# Patient Record
Sex: Female | Born: 1980 | Race: Black or African American | Hispanic: No | Marital: Married | Smoking: Never smoker
Health system: Southern US, Community
[De-identification: ages and names within clinical notes are randomized; demographics above are authoritative.]

## PROBLEM LIST (undated history)

## (undated) ENCOUNTER — Inpatient Hospital Stay (HOSPITAL_COMMUNITY): Payer: Self-pay

## (undated) DIAGNOSIS — R7303 Prediabetes: Secondary | ICD-10-CM

## (undated) DIAGNOSIS — D649 Anemia, unspecified: Secondary | ICD-10-CM

## (undated) DIAGNOSIS — O24419 Gestational diabetes mellitus in pregnancy, unspecified control: Secondary | ICD-10-CM

## (undated) DIAGNOSIS — K219 Gastro-esophageal reflux disease without esophagitis: Secondary | ICD-10-CM

## (undated) HISTORY — DX: Prediabetes: R73.03

## (undated) HISTORY — PX: CHOLECYSTECTOMY: SHX55

---
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
## 2004-09-08 ENCOUNTER — Other Ambulatory Visit: Admission: RE | Admit: 2004-09-08 | Discharge: 2004-09-08 | Payer: Self-pay | Admitting: Obstetrics and Gynecology

## 2005-07-26 ENCOUNTER — Other Ambulatory Visit: Admission: RE | Admit: 2005-07-26 | Discharge: 2005-07-26 | Payer: Self-pay | Admitting: Obstetrics & Gynecology

## 2006-02-18 ENCOUNTER — Other Ambulatory Visit: Admission: RE | Admit: 2006-02-18 | Discharge: 2006-02-18 | Payer: Self-pay | Admitting: Obstetrics & Gynecology

## 2006-10-28 ENCOUNTER — Other Ambulatory Visit: Admission: RE | Admit: 2006-10-28 | Discharge: 2006-10-28 | Payer: Self-pay | Admitting: Obstetrics and Gynecology

## 2009-10-17 ENCOUNTER — Other Ambulatory Visit: Admission: RE | Admit: 2009-10-17 | Discharge: 2009-10-17 | Payer: Self-pay | Admitting: Family Medicine

## 2010-05-01 ENCOUNTER — Other Ambulatory Visit (HOSPITAL_COMMUNITY)
Admission: RE | Admit: 2010-05-01 | Discharge: 2010-05-01 | Disposition: A | Discharge status: Home / Self Care | Payer: BC Managed Care – PPO | Source: Ambulatory Visit | Attending: Family Medicine | Admitting: Family Medicine

## 2010-05-01 ENCOUNTER — Other Ambulatory Visit: Payer: Self-pay | Admitting: Family Medicine

## 2010-05-01 DIAGNOSIS — Z124 Encounter for screening for malignant neoplasm of cervix: Secondary | ICD-10-CM | POA: Insufficient documentation

## 2010-10-23 ENCOUNTER — Other Ambulatory Visit: Payer: Self-pay | Admitting: Family Medicine

## 2010-10-23 DIAGNOSIS — R52 Pain, unspecified: Secondary | ICD-10-CM

## 2010-10-23 DIAGNOSIS — R609 Edema, unspecified: Secondary | ICD-10-CM

## 2010-10-29 ENCOUNTER — Ambulatory Visit
Admission: RE | Admit: 2010-10-29 | Discharge: 2010-10-29 | Disposition: A | Discharge status: Home / Self Care | Payer: BC Managed Care – PPO | Source: Ambulatory Visit | Attending: Family Medicine | Admitting: Family Medicine

## 2010-10-29 DIAGNOSIS — R609 Edema, unspecified: Secondary | ICD-10-CM

## 2010-10-29 DIAGNOSIS — R52 Pain, unspecified: Secondary | ICD-10-CM

## 2010-11-19 ENCOUNTER — Other Ambulatory Visit: Payer: Self-pay | Admitting: Family Medicine

## 2010-11-19 ENCOUNTER — Other Ambulatory Visit (HOSPITAL_COMMUNITY)
Admission: RE | Admit: 2010-11-19 | Discharge: 2010-11-19 | Disposition: A | Discharge status: Home / Self Care | Payer: BC Managed Care – PPO | Source: Ambulatory Visit | Attending: Family Medicine | Admitting: Family Medicine

## 2010-11-19 DIAGNOSIS — Z124 Encounter for screening for malignant neoplasm of cervix: Secondary | ICD-10-CM | POA: Insufficient documentation

## 2011-01-26 ENCOUNTER — Ambulatory Visit (INDEPENDENT_AMBULATORY_CARE_PROVIDER_SITE_OTHER): Payer: 59 | Admitting: General Surgery

## 2011-01-26 ENCOUNTER — Encounter (INDEPENDENT_AMBULATORY_CARE_PROVIDER_SITE_OTHER): Payer: Self-pay | Admitting: General Surgery

## 2011-01-26 VITALS — BP 104/68 | HR 80 | Temp 98.4°F | Resp 18 | Ht 62.0 in | Wt 223.8 lb

## 2011-01-26 DIAGNOSIS — R2240 Localized swelling, mass and lump, unspecified lower limb: Secondary | ICD-10-CM | POA: Insufficient documentation

## 2011-01-26 DIAGNOSIS — R229 Localized swelling, mass and lump, unspecified: Secondary | ICD-10-CM

## 2011-01-26 NOTE — Progress Notes (Signed)
Subjective:     Patient ID: Claire Rojas, female   DOB: 07/25/1980, 30 y.o.   MRN: [id number]  HPI We're asked to see the patient in consultation by Dr. Zachery Dauer for evaluation of a mass on the right knee. The patient is a 30 year old black female who has had a swollen area over the last few weeks and months anterior to the right knee. The area is tender to palpation. She does not recall any trauma to the area. She denies any fevers or chills or recent infection of the right leg  Review of Systems  Constitutional: Negative.   HENT: Negative.   Eyes: Negative.   Respiratory: Negative.   Cardiovascular: Negative.   Gastrointestinal: Negative.   Genitourinary: Negative.   Musculoskeletal: Negative.   Skin: Negative.   Neurological: Negative.   Hematological: Negative.   Psychiatric/Behavioral: Negative.        Objective:   Physical Exam  Constitutional: She is oriented to person, place, and time. She appears well-developed and well-nourished.  HENT:  Head: Normocephalic and atraumatic.  Eyes: Conjunctivae and EOM are normal. Pupils are equal, round, and reactive to light.  Neck: Normal range of motion. Neck supple.  Cardiovascular: Normal rate, regular rhythm and normal heart sounds.   Pulmonary/Chest: Effort normal and breath sounds normal.  Abdominal: Soft. Bowel sounds are normal.  Musculoskeletal: Normal range of motion.       A small 1 cm mass that seems to be isolated to the skin and subcutaneous tissue of the anterior medial right knee area. Tender to manipulation. No evidence of infection.  Neurological: She is alert and oriented to person, place, and time.  Skin: Skin is warm and dry.  Psychiatric: She has a normal mood and affect. Her behavior is normal.       Assessment:     Tender mass in the skin and subcutaneous tissue anterior to the right knee    Plan:     Because the mass seems to be enlarging and is very tender she was like to have it removed. I think this  is a reasonable thing. I've discussed with her in detail the risks and benefits of the operation remove this mass as well as some of the technical aspects and she understands and wishes to proceed.

## 2013-02-06 LAB — OB RESULTS CONSOLE ABO/RH: RH Type: POSITIVE

## 2013-02-06 LAB — OB RESULTS CONSOLE HEPATITIS B SURFACE ANTIGEN: HEP B S AG: NEGATIVE

## 2013-02-06 LAB — OB RESULTS CONSOLE HIV ANTIBODY (ROUTINE TESTING): HIV: NONREACTIVE

## 2013-02-06 LAB — OB RESULTS CONSOLE RPR: RPR: NONREACTIVE

## 2013-02-06 LAB — OB RESULTS CONSOLE RUBELLA ANTIBODY, IGM: RUBELLA: IMMUNE

## 2013-02-06 LAB — OB RESULTS CONSOLE ANTIBODY SCREEN: ANTIBODY SCREEN: NEGATIVE

## 2013-03-14 ENCOUNTER — Encounter: Payer: BC Managed Care – PPO | Attending: Obstetrics and Gynecology | Admitting: *Deleted

## 2013-03-14 ENCOUNTER — Encounter: Payer: Self-pay | Admitting: *Deleted

## 2013-03-14 VITALS — Ht 62.0 in | Wt 222.6 lb

## 2013-03-14 DIAGNOSIS — R7309 Other abnormal glucose: Secondary | ICD-10-CM

## 2013-03-14 DIAGNOSIS — Z713 Dietary counseling and surveillance: Secondary | ICD-10-CM | POA: Insufficient documentation

## 2013-03-14 NOTE — Progress Notes (Signed)
Appt start time: 1330 end time:  1430.  Assessment:  Patient was seen on  03/14/13 for individual diabetes education. Claire Rojas has been pre-diabetic for awhile. She has a significant family history of T2DM. She is now [redacted] weeks pregnant. Her desire is to be proactive with glucose management with her pregnancy. She has not had any prior education DSME. At present she is not testing her glucose at this time. She noted that she and Dr. Tenny Crawoss at discussed the possibility. I have contacted Dr. Waynard ReedsKendra Ross and obtained an order for glucose testing BID. They will provide order for testing supplies to patient pharmacy. Patient feels safe in home environment, at no risk for harming self, no risk for falls.  Current HbA1c: 6.1%  Preferred Learning Style:  No preference indicated   Learning Readiness:   Change in progress  MEDICATIONS: None for diabetes  Usual physical activity: none at this time, does have gym membership but has not been active   Intervention:  Nutrition counseling provided.  Discussed diabetes disease process and treatment options.  Discussed physiology of diabetes and role of obesity on insulin resistance.  Encouraged moderate weight reduction to improve glucose levels.  Discussed role of medications and diet in glucose control  Provided education on macronutrients on glucose levels.  Provided education on carb counting, importance of regularly scheduled meals/snacks, and meal planning  Discussed effects of physical activity on glucose levels and long-term glucose control.  Recommended 150 minutes of physical activity/week.  Reviewed patient medications.  Discussed role of medication on blood glucose and possible side effects  Discussed blood glucose monitoring and interpretation.  Discussed recommended target ranges and individual ranges.    Described short-term complications: hyper- and hypo-glycemia.  Discussed causes,symptoms, and treatment options.  Discussed prevention,  detection, and treatment of long-term complications.  Discussed the role of prolonged elevated glucose levels on body systems.  Discussed role of stress on blood glucose levels and discussed strategies to manage psychosocial issues.  Discussed recommendations for long-term diabetes self-care.  Established checklist for medical, dental, and emotional self-care.  Plan:  Aim for 2- 3 Carb Choices per meal (45 grams) for breakfast, lunch and dinner Aim for 1-2 Carbs per snack Begin reading food labels for Total Carbohydrate and sugar grams of foods Consider  increasing your activity level by walking daily as tolerated Begin checking BG before breakfast and 1-2 hours after first bit of dinner as directed by MD   Blood glucose monitor given: one Touch Ultra 2 Lot # [id number] X Exp: 04/2014 Blood glucose reading: 120 1hpp  Patient instructed to monitor glucose levels: FBS: 60 - <90 2 hour: <120  Teaching Method Utilized:  Visual Auditory Hands on  Handouts given during visit include: Living Well with Diabetes Carb Counting and Food Label handouts Meal Plan Card My plate  Barriers to learning/adherence to lifestyle change: pregnancy  Diabetes self-care support plan:   [facility] support group  OB/GYN support  family  Demonstrated degree of understanding via:  Teach Back   Monitoring/Evaluation:  Dietary intake, exercise, test glucose, and body weight follow up prn.

## 2013-03-15 NOTE — Patient Instructions (Signed)
Plan:  Aim for 2- 3 Carb Choices per meal (45 grams) for breakfast, lunch and dinner Aim for 1-2 Carbs per snack Begin reading food labels for Total Carbohydrate and sugar grams of foods Consider  increasing your activity level by walking daily as tolerated Begin checking BG before breakfast and 1-2 hours after first bit of dinner as directed by MD   Blood glucose monitor given: one Touch Ultra 2 Lot # [id number] X Exp: 04/2014 Blood glucose reading: 120 1hpp  Patient instructed to monitor glucose levels: FBS: 60 - <90 2 hour: <120

## 2013-04-30 ENCOUNTER — Encounter (INDEPENDENT_AMBULATORY_CARE_PROVIDER_SITE_OTHER): Payer: Self-pay | Admitting: General Surgery

## 2013-05-11 ENCOUNTER — Other Ambulatory Visit: Payer: Self-pay

## 2013-05-21 ENCOUNTER — Encounter (HOSPITAL_COMMUNITY): Payer: Self-pay

## 2013-05-21 ENCOUNTER — Inpatient Hospital Stay (HOSPITAL_COMMUNITY)
Admission: AD | Admit: 2013-05-21 | Discharge: 2013-05-21 | Disposition: A | Discharge status: Home / Self Care | Payer: BC Managed Care – PPO | Source: Ambulatory Visit | Attending: Obstetrics & Gynecology | Admitting: Obstetrics & Gynecology

## 2013-05-21 DIAGNOSIS — Z8249 Family history of ischemic heart disease and other diseases of the circulatory system: Secondary | ICD-10-CM | POA: Insufficient documentation

## 2013-05-21 DIAGNOSIS — O368190 Decreased fetal movements, unspecified trimester, not applicable or unspecified: Secondary | ICD-10-CM | POA: Insufficient documentation

## 2013-05-21 DIAGNOSIS — Z3490 Encounter for supervision of normal pregnancy, unspecified, unspecified trimester: Secondary | ICD-10-CM

## 2013-05-21 DIAGNOSIS — Z833 Family history of diabetes mellitus: Secondary | ICD-10-CM | POA: Insufficient documentation

## 2013-05-21 NOTE — Discharge Instructions (Signed)
Second Trimester of Pregnancy °The second trimester is from week 13 through week 28, month 4 through 6. This is often the time in pregnancy that you feel your best. Often times, morning sickness has lessened or quit. You may have more energy, and you may get hungry more often. Your unborn baby (fetus) is growing rapidly. At the end of the sixth month, he or she is about 9 inches long and weighs about 1½ pounds. You will likely feel the baby move (quickening) between 18 and 20 weeks of pregnancy. °HOME CARE  °· Avoid all smoking, herbs, and alcohol. Avoid drugs not approved by your doctor. °· Only take medicine as told by your doctor. Some medicines are safe and some are not during pregnancy. °· Exercise only as told by your doctor. Stop exercising if you start having cramps. °· Eat regular, healthy meals. °· Wear a good support bra if your breasts are tender. °· Do not use hot tubs, steam rooms, or saunas. °· Wear your seat belt when driving. °· Avoid raw meat, uncooked cheese, and liter boxes and soil used by cats. °· Take your prenatal vitamins. °· Try taking medicine that helps you poop (stool softener) as needed, and if your doctor approves. Eat more fiber by eating fresh fruit, vegetables, and whole grains. Drink enough fluids to keep your pee (urine) clear or pale yellow. °· Take warm water baths (sitz baths) to soothe pain or discomfort caused by hemorrhoids. Use hemorrhoid cream if your doctor approves. °· If you have puffy, bulging veins (varicose veins), wear support hose. Raise (elevate) your feet for 15 minutes, 3 4 times a day. Limit salt in your diet. °· Avoid heavy lifting, wear low heals, and sit up straight. °· Rest with your legs raised if you have leg cramps or low back pain. °· Visit your dentist if you have not gone during your pregnancy. Use a soft toothbrush to brush your teeth. Be gentle when you floss. °· You can have sex (intercourse) unless your doctor tells you not to. °· Go to your  doctor visits. °GET HELP IF:  °· You feel dizzy. °· You have mild cramps or pressure in your lower belly (abdomen). °· You have a nagging pain in your belly area. °· You continue to feel sick to your stomach (nauseous), throw up (vomit), or have watery poop (diarrhea). °· You have bad smelling fluid coming from your vagina. °· You have pain with peeing (urination). °GET HELP RIGHT AWAY IF:  °· You have a fever. °· You are leaking fluid from your vagina. °· You have spotting or bleeding from your vagina. °· You have severe belly cramping or pain. °· You lose or gain weight rapidly. °· You have trouble catching your breath and have chest pain. °· You notice sudden or extreme puffiness (swelling) of your face, hands, ankles, feet, or legs. °· You have not felt the baby move in over an hour. °· You have severe headaches that do not go away with medicine. °· You have vision changes. °Document Released: 04/28/2009 Document Revised: 05/29/2012 Document Reviewed: 04/04/2012 °[employer]® Patient Information ©2014 [employer]. ° °

## 2013-05-21 NOTE — MAU Note (Signed)
Patient states she has been feeling fetal movement but has not felt movement for the past 48 hours. Fetal heart rate in triage in the 140's with a lot of fetal movement heard. Denies pain, bleeding or leaking, nausea or vomiting.

## 2013-05-21 NOTE — MAU Provider Note (Signed)
  History     CSN: [id number]  Arrival date and time: 05/21/13 1134   None     Chief Complaint  Patient presents with  . Decreased Fetal Movement   HPI Claire Rojas is 33 y.o. G2P1001 1216w0d weeks presenting with report of decreased fetal movement.  She is a patient of Dr. Charlott Rakesoss's.  She states she felt a lot of movement until today.  + FHT-140 bpm with movement heard at time of dopplers per RN note. She denies vaginal bleeding, loss of fluid.  She reports having felt movement while in the room.    Past Medical History  Diagnosis Date  . Diabetes mellitus     gestational  . Glucose intolerance (pre-diabetes)     Past Surgical History  Procedure Laterality Date  . Cesarean section  04/26/1998  . Cholecystectomy  06/26/1998  . Nasal polyp excision  08/13/1998  . Cesarean section    . Cholecystectomy      Family History  Problem Relation Age of Onset  . Hypertension Mother   . Hypertension Father   . Diabetes Mother   . Diabetes Maternal Grandmother   . Diabetes Maternal Grandfather     History  Substance Use Topics  . Smoking status: Never Smoker   . Smokeless tobacco: Not on file  . Alcohol Use: No    Allergies: No Known Allergies  Prescriptions prior to admission  Medication Sig Dispense Refill  . acetaminophen (TYLENOL) 325 MG tablet Take 650 mg by mouth every 6 (six) hours as needed.      . bisacodyl (DULCOLAX) 5 MG EC tablet Take 5 mg by mouth daily as needed for moderate constipation.      . calcium carbonate (TUMS - DOSED IN MG ELEMENTAL CALCIUM) 500 MG chewable tablet Chew 1 tablet by mouth as needed for indigestion or heartburn.      . Prenatal Vit-Fe Fumarate-FA (PRENATAL MULTIVITAMIN) TABS tablet Take 1 tablet by mouth daily at 12 noon.        Review of Systems  Gastrointestinal:       Decreased fetal movement  Genitourinary:       Neg for vaginal bleeding or leaking of fluid   Physical Exam   Blood pressure 105/61, pulse 109, temperature 98.4 F (36.9  C), temperature source Oral, resp. rate 16, height 5\' 1"  (1.549 m), weight 221 lb 3.2 oz (100.336 kg), SpO2 99.00%.  Physical Exam  Constitutional: She is oriented to person, place, and time. She appears well-developed and well-nourished. No distress.  HENT:  Head: Normocephalic.  Genitourinary:  Pelvic exam not indicated  Neurological: She is alert and oriented to person, place, and time.  Skin: Skin is warm and dry.  Psychiatric: She has a normal mood and affect. Her behavior is normal.   FMS neg for contractions MAU Course  Procedures  MDM Report given to Dr. Mora ApplPinn.  Instructed to reassure patient.  Continue follow up in the office.  Assessment and Plan  A:  [redacted] wks gestation with perceived decreased fetal movement--  FHR 140 with movement noted  P:  Patient was reassured      Continue prenatal care in office  Nan Maya,EVE M 05/21/2013, 12:37 PM

## 2013-05-21 NOTE — MAU Provider Note (Signed)
I discussed case with CNM and agree with above  Atif Chapple STACIA

## 2013-05-25 ENCOUNTER — Other Ambulatory Visit (HOSPITAL_COMMUNITY): Payer: Self-pay | Admitting: Obstetrics & Gynecology

## 2013-05-25 ENCOUNTER — Encounter (HOSPITAL_COMMUNITY): Payer: Self-pay | Admitting: Obstetrics & Gynecology

## 2013-05-25 DIAGNOSIS — Z3689 Encounter for other specified antenatal screening: Secondary | ICD-10-CM

## 2013-05-25 DIAGNOSIS — O24419 Gestational diabetes mellitus in pregnancy, unspecified control: Secondary | ICD-10-CM

## 2013-06-05 ENCOUNTER — Ambulatory Visit (HOSPITAL_COMMUNITY)
Admission: RE | Admit: 2013-06-05 | Discharge: 2013-06-05 | Disposition: A | Discharge status: Home / Self Care | Payer: BC Managed Care – PPO | Source: Ambulatory Visit | Attending: Obstetrics & Gynecology | Admitting: Obstetrics & Gynecology

## 2013-06-05 ENCOUNTER — Encounter (HOSPITAL_COMMUNITY): Payer: Self-pay

## 2013-06-05 DIAGNOSIS — O24419 Gestational diabetes mellitus in pregnancy, unspecified control: Secondary | ICD-10-CM

## 2013-06-05 DIAGNOSIS — O99810 Abnormal glucose complicating pregnancy: Secondary | ICD-10-CM | POA: Insufficient documentation

## 2013-06-05 DIAGNOSIS — Z3689 Encounter for other specified antenatal screening: Secondary | ICD-10-CM

## 2013-06-05 IMAGING — US US OB DETAIL+14 WK
1 series · 12 of 28 positions shown · non-contrast
Comparison: none

[Series 1: us ob detail+14 wk · 12 of 78 slices shown]
[im 3/78]
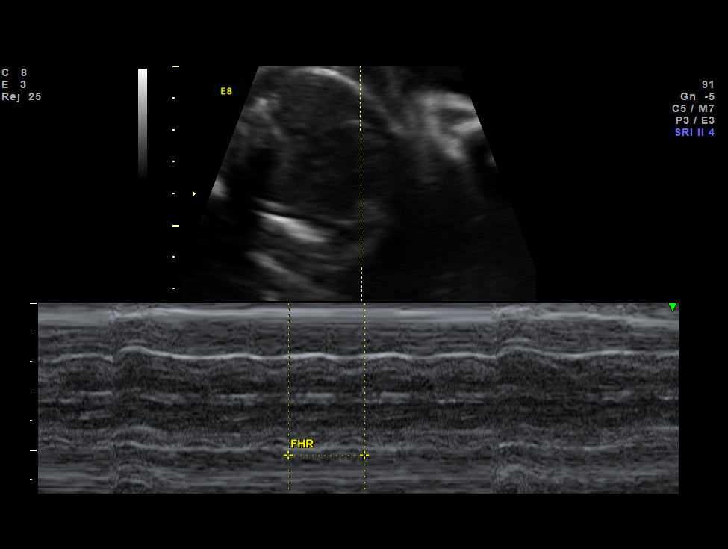
[im 9/78]
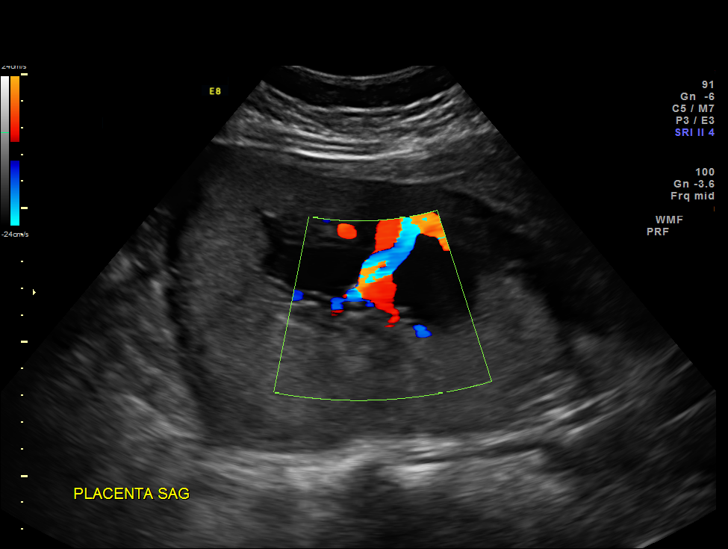
[im 15/78]
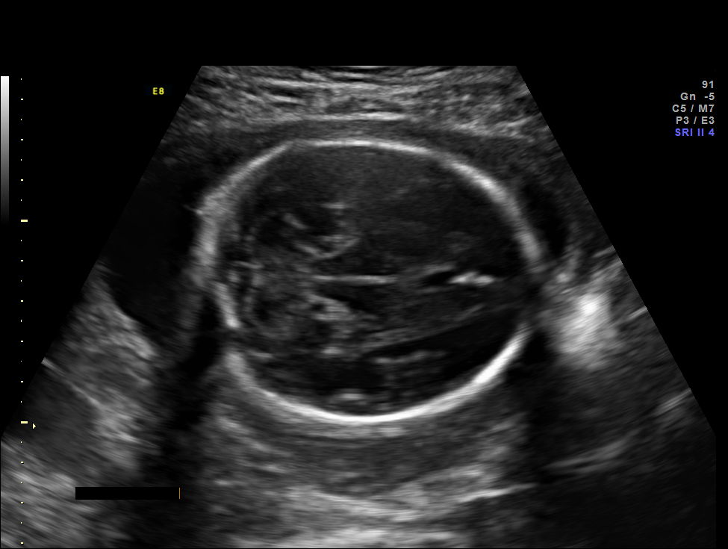
[im 23/78]
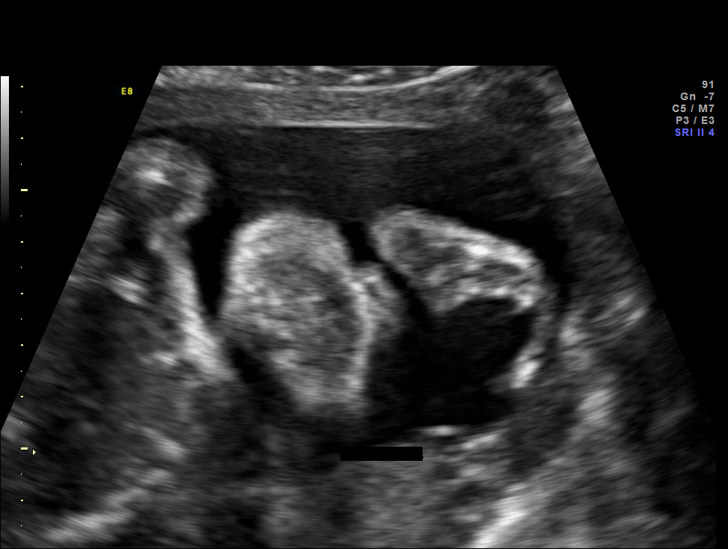
[im 29/78]
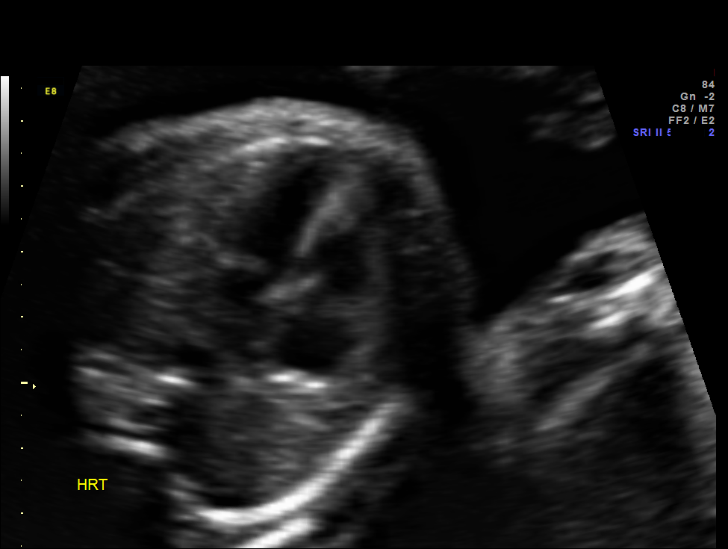
[im 35/78]
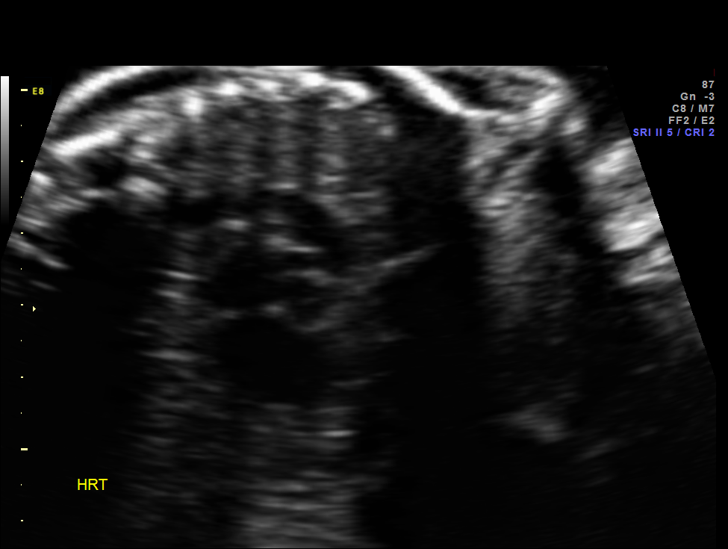
[im 43/78]
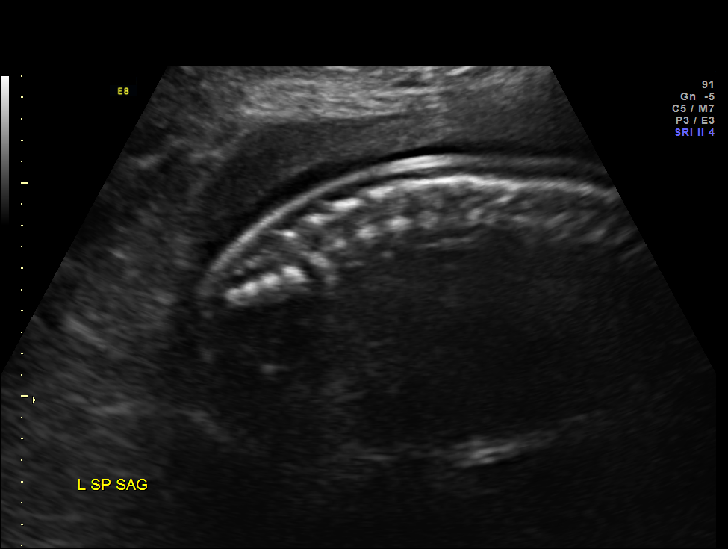
[im 49/78]
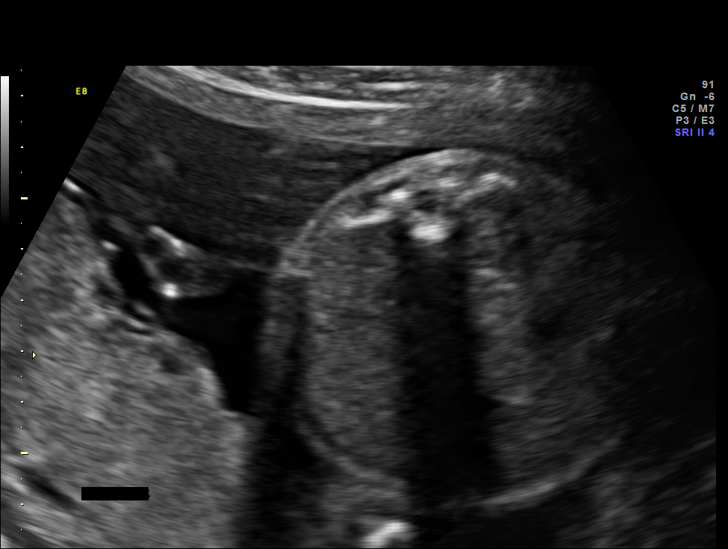
[im 55/78]
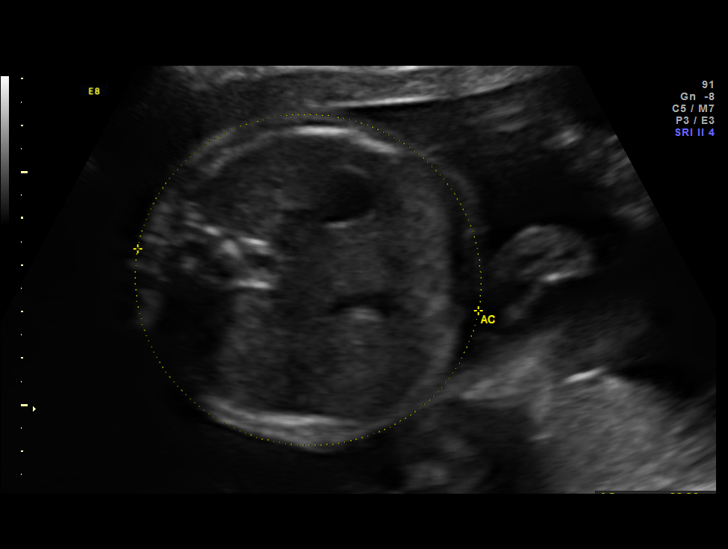
[im 63/78]
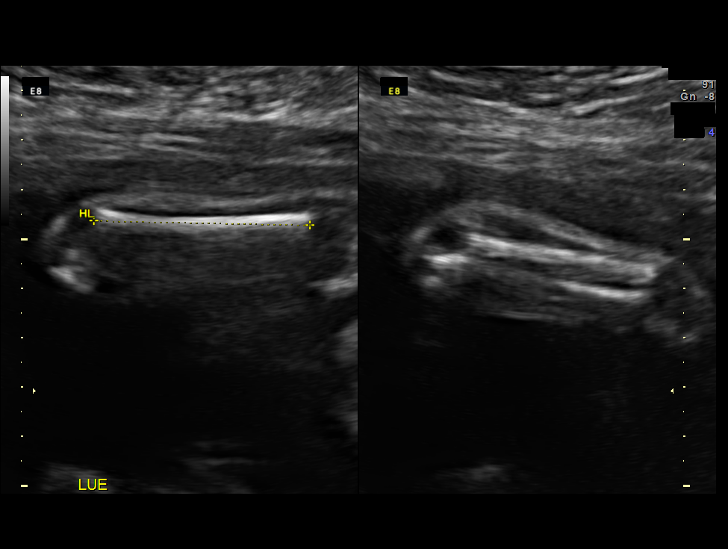
[im 69/78]
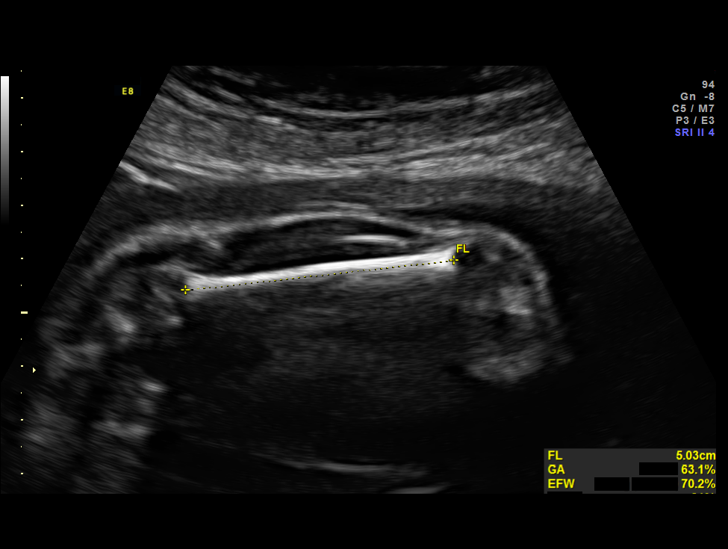
[im 75/78]
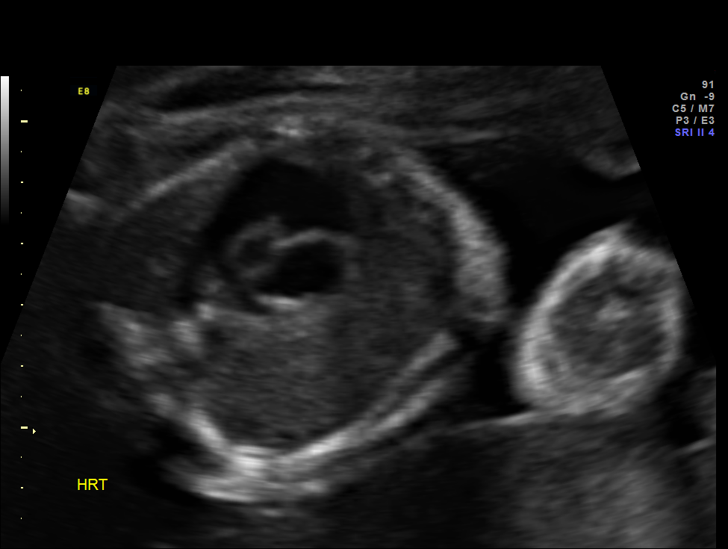

[12 of 28 positions shown; findings below may reference images not displayed]

OBSTETRICS REPORT
                      (Signed Final [DATE] [DATE])

Service(s) Provided

 US OB DETAIL + 14 WK                                  76811.0
Indications

 Detailed fetal anatomic survey                        655.83 [8G]
 Diabetes - Gestational, A1 (diet controlled)
 Poor obstetric history: Previous gestational          [8G]
 diabetes
 History of cesarean delivery, currently pregnant      654.20, [8G]
Fetal Evaluation

 Num Of Fetuses:    1
 Fetal Heart Rate:  132                          bpm
 Cardiac Activity:  Observed
 Presentation:      Cephalic
 Placenta:          Posterior, above cervical
                    os
 P. Cord            Visualized, central
 Insertion:

 Amniotic Fluid
 AFI FV:      Subjectively upper-normal
                                             Larg Pckt:     8.5  cm
Biometry

 BPD:       70  mm     G. Age:  28w 1d                CI:         79.5   70 - 86
 OFD:       88  mm                                    FL/HC:      19.8   18.6 -

 HC:     252.9  mm     G. Age:  27w 3d       70  %    HC/AC:      1.09   1.04 -

 AC:     232.1  mm     G. Age:  27w 4d       81  %    FL/BPD:     71.7   71 - 87
 FL:      50.2  mm     G. Age:  27w 0d       61  %    FL/AC:      21.6   20 - 24
 HUM:     44.4  mm     G. Age:  26w 2d       51  %
 CER:       31  mm     G. Age:  27w 0d       70  %

 Est. FW:    [8G]  gm      2 lb 6 oz     76  %
Gestational Age

 LMP:           27w 4d        Date:  [DATE]                 EDD:   [DATE]
 U/S Today:     27w 4d                                        EDD:   [DATE]
 Best:          26w 1d     Det. By:  Early Ultrasound         EDD:   [DATE]
                                     ([DATE])
Anatomy

 Cranium:          Appears normal         Aortic Arch:      Appears normal
 Fetal Cavum:      Appears normal         Ductal Arch:      Appears normal
 Ventricles:       Appears normal         Diaphragm:        Appears normal
 Choroid Plexus:   Appears normal         Stomach:          Appears normal
 Cerebellum:       Appears normal         Abdomen:          Appears normal
 Posterior Fossa:  Appears normal         Abdominal Wall:   Appears nml (cord
                                                            insert, abd wall)
 Nuchal Fold:      Not applicable (>20    Cord Vessels:     Appears normal (3
                   wks GA)                                  vessel cord)
 Face:             Appears normal         Kidneys:          Appear normal
                   (orbits and profile)
 Lips:             Appears normal         Bladder:          Appears normal
 Heart:            Appears normal         Spine:            Appears normal
                   (4CH, axis, and
                   situs)
 RVOT:             Appears normal         Lower             Visualized
                                          Extremities:
 LVOT:             Appears normal         Upper             Visualized
                                          Extremities:

 Other:  Fetus appears to be a male. Heels visualized. Nasal bone visualized.
         Technically difficult due to advanced GA and fetal position.
Targeted Anatomy

 Fetal Central Nervous System
 Cisterna Magna:
Cervix Uterus Adnexa

 Cervical Length:    4.1      cm

 Cervix:       Normal appearance by transabdominal scan.

 Left Ovary:    Within normal limits.
 Right Ovary:   Within normal limits.
 Adnexa:     No abnormality visualized.
Impression

 Single IUP at 27w 4d
 A2 GDM
 Normal fetal anatomic survey
 Fetal growth is appropriate (76th %tile)
 Posterior placenta
 Normal amniotic fluid volume
Recommendations

 See separate consult letter
 Reecommend serial growth scans every 4 weeks - please
 contact our office if you would prefer that these studies be
 performed here
 Antepartum fetal testing (2x weekly NSTs with weekly AFIs)
 beginning at 32 weeks.

 questions or concerns.

## 2013-06-05 NOTE — Consult Note (Signed)
Maternal Fetal Medicine Consultation  Requesting Provider(s): Essie HartWalda Pinn, MD  Reason for consultation: A2 GDM, possible pre gestational diabetes, assist with management  HPI: Claire Silviusiffany Rojas is a 33 yo G3P1011, EDD 08/31/2013 currently at 4432w1d who was seen for consultation due to A2 GDM.  Claire Rojas reports that she was diagnosed with GDM late in her last pregnancy - delivered shortly after the diagnosis was made. Early during her pregnancy, a HbA1C of 6.1 was noted. A 1-hr OGTT showed a value of 205 mg/dl.  The patient reports that she has been following her fingerstick values, but review of the records shows that she has not checked her values consistently.  Last week, she was encouraged to start Glyburide - held off for a week to be very compliant with her diet.  She reports that since that time, her post-prandial values have all been < 120 mg/dl but she continues to have elevated fasting values in the 100-110 mg/dl range.  The patient's prenatal course has otherwise been uncomplicated.  She is without complaints today.  OB History: OB History   Grav Para Term Preterm Abortions TAB SAB Ect Mult Living   2 1 1       1     G1- early TAB G2 - Term C-section for arrest of dilation; reports diagnosis of GDM shortly before delivery G3- current pregnancy  PMH:  Past Medical History  Diagnosis Date  . Diabetes mellitus     gestational  . Glucose intolerance (pre-diabetes)     PSH:  Past Surgical History  Procedure Laterality Date  . Cesarean section  04/26/1998  . Cholecystectomy  06/26/1998  . Nasal polyp excision  08/13/1998  . Cesarean section    . Cholecystectomy     Meds:  Current Outpatient Prescriptions on File Prior to Encounter  Medication Sig Dispense Refill  . acetaminophen (TYLENOL) 325 MG tablet Take 650 mg by mouth every 6 (six) hours as needed.      . bisacodyl (DULCOLAX) 5 MG EC tablet Take 5 mg by mouth daily as needed for moderate constipation.      . calcium carbonate  (TUMS - DOSED IN MG ELEMENTAL CALCIUM) 500 MG chewable tablet Chew 1 tablet by mouth as needed for indigestion or heartburn.      . Prenatal Vit-Fe Fumarate-FA (PRENATAL MULTIVITAMIN) TABS tablet Take 1 tablet by mouth daily at 12 noon.       No current facility-administered medications on file prior to encounter.   Allergies: No Known Allergies  FH: Family History  Problem Relation Age of Onset  . Hypertension Mother   . Hypertension Father   . Diabetes Mother   . Diabetes Maternal Grandmother   . Diabetes Maternal Grandfather   Denies family history of birth defects or hereditary disorders  Soc:  History   Social History  . Marital Status: Married    Spouse Name: N/A    Number of Children: N/A  . Years of Education: N/A   Occupational History  . Not on file.   Social History Main Topics  . Smoking status: Never Smoker   . Smokeless tobacco: Not on file  . Alcohol Use: No  . Drug Use: No  . Sexual Activity: Yes   Other Topics Concern  . Not on file   Social History Narrative   ** Merged History Encounter **        Review of Systems: no vaginal bleeding or cramping/contractions, no LOF, no nausea/vomiting. All other systems reviewed and are  negative.  PE:  VS: BP    125/79               pulse    93               Weight  220.5# GEN: well-appearing female ABD: gravid, NT  Ultrasound: (see full report in EPIC) Single IUP at 27w 4d A2 GDM Normal fetal anatomic survey Fetal growth is appropriate (76th %tile) Posterior placenta Normal amniotic fluid volume  A/P: 1) Single IUP at 9242w1d         2) Hx of previous C-section - leaning toward TOLAC         3) A2 GDM - due to HbA1C of 6.1 in early pregnancy, would suspect that the patient likely has impaired fasting glucose vs. Glucose intolerance outside of pregnancy.  I stressed the importance of following fingerstick values - briefly reviewed potential risks of diabetes in pregnancy to include LGA infant, increased  risk of C-section and birth injury, risk of metabolic derangements, hyperbilirubinemia.  I offered the patient the opportunity to meet with our diabetic educator again and she has agreed to follow up with her for further counseling and recommendations.   Recommendations: 1) Concur with Glyburide as written - she may need a higher dose based on fingerstick values but would start out with   2.5 mg qHS for now.  2) 4x daily fingersticks (fasting, 2 hr post prandial values)  3) Serial growth scans every 4 weeks - please contact our office if you would prefer that these studies be scheduled with MFM  4) Antepartum fetal testing beginning at 32 weeks (2x weekly NSTs with weekly AFIs)  5) Delivery at 39 weeks   Thank you for the opportunity to be a part of the care of Claire Rojas. Please contact our office if we can be of further assistance.   I spent approximately 30 minutes with this patient with over 50% of time spent in face-to-face counseling.  Alpha GulaPaul Vannie Hilgert, MD Maternal Fetal Medicine

## 2013-06-07 ENCOUNTER — Ambulatory Visit (HOSPITAL_COMMUNITY)
Admission: RE | Admit: 2013-06-07 | Discharge: 2013-06-07 | Disposition: A | Discharge status: Home / Self Care | Payer: BC Managed Care – PPO | Source: Ambulatory Visit | Attending: Obstetrics & Gynecology | Admitting: Obstetrics & Gynecology

## 2013-06-07 DIAGNOSIS — O99810 Abnormal glucose complicating pregnancy: Secondary | ICD-10-CM

## 2013-06-07 NOTE — Progress Notes (Signed)
  Patient was seen on 06/07/13 for Gestational Diabetes self-management . The following learning objectives were met by the patient:   States the definition of Gestational Diabetes  States why dietary management is important in controlling blood glucose  Describes the effects of carbohydrates on blood glucose levels  Demonstrates ability to create a balanced meal plan  Demonstrates carbohydrate counting   States when to check blood glucose levels  Demonstrates proper blood glucose monitoring techniques  States the effect of stress and exercise on blood glucose levels  States the importance of limiting caffeine and abstaining from alcohol and smoking  Plan:  Aim for 2 Carb Choices per meal (30 grams) +/- 1 either way for breakfast Aim for 3 Carb Choices per meal (45 grams) +/- 1 either way from lunch and dinner Aim for 1-2 Carbs per snack Begin reading food labels for Total Carbohydrate and sugar grams of foods Consider  increasing your activity level by walking daily as tolerated Begin checking BG before breakfast and 2 hours after first bit of breakfast, lunch and dinner after  as directed by MD  Take medication  as directed by MD  Medication: Patient presently taking glyburide 2.5mg  @ hs FBS 69-75 2hpp 77-122  Patient instructed to monitor glucose levels: FBS: 60 - <90 2 hour: <120 I anticipate that 2hpp readings will come down with adaptation of prescribed Meal Plan.  Patient received the following handouts:  Nutrition Diabetes and Pregnancy  Carbohydrate Counting List  Meal Planning worksheet  Patient will be seen for follow-up as needed.

## 2013-06-12 ENCOUNTER — Other Ambulatory Visit (HOSPITAL_COMMUNITY): Payer: Self-pay | Admitting: Obstetrics & Gynecology

## 2013-06-12 DIAGNOSIS — O3421 Maternal care for scar from previous cesarean delivery: Secondary | ICD-10-CM

## 2013-06-12 DIAGNOSIS — O09299 Supervision of pregnancy with other poor reproductive or obstetric history, unspecified trimester: Secondary | ICD-10-CM

## 2013-06-12 DIAGNOSIS — O99810 Abnormal glucose complicating pregnancy: Secondary | ICD-10-CM

## 2013-07-03 ENCOUNTER — Ambulatory Visit (HOSPITAL_COMMUNITY)
Admission: RE | Admit: 2013-07-03 | Discharge: 2013-07-03 | Disposition: A | Discharge status: Home / Self Care | Payer: BC Managed Care – PPO | Source: Ambulatory Visit | Attending: Obstetrics & Gynecology | Admitting: Obstetrics & Gynecology

## 2013-07-03 DIAGNOSIS — O99810 Abnormal glucose complicating pregnancy: Secondary | ICD-10-CM

## 2013-07-03 DIAGNOSIS — O34219 Maternal care for unspecified type scar from previous cesarean delivery: Secondary | ICD-10-CM | POA: Insufficient documentation

## 2013-07-03 DIAGNOSIS — O09299 Supervision of pregnancy with other poor reproductive or obstetric history, unspecified trimester: Secondary | ICD-10-CM

## 2013-07-03 DIAGNOSIS — O3421 Maternal care for scar from previous cesarean delivery: Secondary | ICD-10-CM

## 2013-07-18 ENCOUNTER — Other Ambulatory Visit (HOSPITAL_COMMUNITY): Payer: Self-pay | Admitting: Obstetrics & Gynecology

## 2013-07-18 DIAGNOSIS — O24919 Unspecified diabetes mellitus in pregnancy, unspecified trimester: Secondary | ICD-10-CM

## 2013-07-26 ENCOUNTER — Other Ambulatory Visit (HOSPITAL_COMMUNITY): Payer: Self-pay | Admitting: Obstetrics & Gynecology

## 2013-07-26 DIAGNOSIS — O24919 Unspecified diabetes mellitus in pregnancy, unspecified trimester: Secondary | ICD-10-CM

## 2013-07-31 ENCOUNTER — Ambulatory Visit (HOSPITAL_COMMUNITY)
Admission: RE | Admit: 2013-07-31 | Discharge: 2013-07-31 | Disposition: A | Discharge status: Home / Self Care | Payer: BC Managed Care – PPO | Source: Ambulatory Visit | Attending: Obstetrics & Gynecology | Admitting: Obstetrics & Gynecology

## 2013-07-31 DIAGNOSIS — Z3689 Encounter for other specified antenatal screening: Secondary | ICD-10-CM | POA: Insufficient documentation

## 2013-07-31 DIAGNOSIS — E119 Type 2 diabetes mellitus without complications: Secondary | ICD-10-CM | POA: Insufficient documentation

## 2013-07-31 DIAGNOSIS — O24919 Unspecified diabetes mellitus in pregnancy, unspecified trimester: Secondary | ICD-10-CM

## 2013-08-24 ENCOUNTER — Encounter (HOSPITAL_COMMUNITY): Payer: Self-pay | Admitting: Pharmacist

## 2013-08-28 ENCOUNTER — Other Ambulatory Visit (HOSPITAL_COMMUNITY): Payer: Self-pay | Admitting: Obstetrics and Gynecology

## 2013-08-28 DIAGNOSIS — O24913 Unspecified diabetes mellitus in pregnancy, third trimester: Secondary | ICD-10-CM

## 2013-08-30 ENCOUNTER — Ambulatory Visit (HOSPITAL_COMMUNITY): Payer: BC Managed Care – PPO

## 2013-08-30 ENCOUNTER — Observation Stay (HOSPITAL_COMMUNITY)
Admission: AD | Admit: 2013-08-30 | Discharge: 2013-08-30 | Disposition: A | Discharge status: Home / Self Care | Payer: BC Managed Care – PPO | Source: Ambulatory Visit | Attending: Obstetrics & Gynecology | Admitting: Obstetrics & Gynecology

## 2013-08-30 ENCOUNTER — Encounter (HOSPITAL_COMMUNITY): Payer: Self-pay

## 2013-08-30 ENCOUNTER — Observation Stay (HOSPITAL_COMMUNITY): Payer: BC Managed Care – PPO

## 2013-08-30 DIAGNOSIS — O24419 Gestational diabetes mellitus in pregnancy, unspecified control: Secondary | ICD-10-CM

## 2013-08-30 DIAGNOSIS — O99810 Abnormal glucose complicating pregnancy: Secondary | ICD-10-CM

## 2013-08-30 DIAGNOSIS — O368190 Decreased fetal movements, unspecified trimester, not applicable or unspecified: Principal | ICD-10-CM | POA: Insufficient documentation

## 2013-08-30 HISTORY — DX: Gestational diabetes mellitus in pregnancy, unspecified control: O24.419

## 2013-08-30 LAB — CBC WITH DIFFERENTIAL/PLATELET
BAND NEUTROPHILS: 0 % (ref 0–10)
Basophils Absolute: 0 10*3/uL (ref 0.0–0.1)
Basophils Relative: 0 % (ref 0–1)
Blasts: 0 %
EOS ABS: 0 10*3/uL (ref 0.0–0.7)
EOS PCT: 0 % (ref 0–5)
HCT: 28.3 % — ABNORMAL LOW (ref 36.0–46.0)
HEMOGLOBIN: 8.4 g/dL — AB (ref 12.0–15.0)
LYMPHS ABS: 2.5 10*3/uL (ref 0.7–4.0)
Lymphocytes Relative: 23 % (ref 12–46)
MCH: 20.5 pg — ABNORMAL LOW (ref 26.0–34.0)
MCHC: 29.7 g/dL — ABNORMAL LOW (ref 30.0–36.0)
MCV: 69.2 fL — AB (ref 78.0–100.0)
MYELOCYTES: 0 %
Metamyelocytes Relative: 0 %
Monocytes Absolute: 0.8 10*3/uL (ref 0.1–1.0)
Monocytes Relative: 7 % (ref 3–12)
NEUTROS ABS: 7.5 10*3/uL (ref 1.7–7.7)
NEUTROS PCT: 70 % (ref 43–77)
Platelets: 345 10*3/uL (ref 150–400)
Promyelocytes Absolute: 0 %
RBC: 4.09 MIL/uL (ref 3.87–5.11)
RDW: 18 % — ABNORMAL HIGH (ref 11.5–15.5)
WBC: 10.8 10*3/uL — ABNORMAL HIGH (ref 4.0–10.5)
nRBC: 0 /100 WBC

## 2013-08-30 LAB — GLUCOSE, CAPILLARY: Glucose-Capillary: 67 mg/dL — ABNORMAL LOW (ref 70–99)

## 2013-08-30 LAB — URINALYSIS, ROUTINE W REFLEX MICROSCOPIC
BILIRUBIN URINE: NEGATIVE
GLUCOSE, UA: NEGATIVE mg/dL
HGB URINE DIPSTICK: NEGATIVE
KETONES UR: NEGATIVE mg/dL
Leukocytes, UA: NEGATIVE
NITRITE: NEGATIVE
PH: 6 (ref 5.0–8.0)
Protein, ur: NEGATIVE mg/dL
SPECIFIC GRAVITY, URINE: 1.015 (ref 1.005–1.030)
Urobilinogen, UA: 0.2 mg/dL (ref 0.0–1.0)

## 2013-08-30 LAB — COMPREHENSIVE METABOLIC PANEL
ALBUMIN: 2.3 g/dL — AB (ref 3.5–5.2)
ALT: 10 U/L (ref 0–35)
ANION GAP: 15 (ref 5–15)
AST: 16 U/L (ref 0–37)
Alkaline Phosphatase: 281 U/L — ABNORMAL HIGH (ref 39–117)
BUN: 7 mg/dL (ref 6–23)
CO2: 19 mEq/L (ref 19–32)
CREATININE: 0.82 mg/dL (ref 0.50–1.10)
Calcium: 9.3 mg/dL (ref 8.4–10.5)
Chloride: 103 mEq/L (ref 96–112)
GFR calc Af Amer: >90 mL/min (ref >90)
GFR calc non Af Amer: >90 mL/min (ref >90)
Glucose, Bld: 103 mg/dL — ABNORMAL HIGH (ref 70–99)
Potassium: 4.1 mEq/L (ref 3.7–5.3)
Sodium: 137 mEq/L (ref 137–147)
Total Bilirubin: <0.2 mg/dL — ABNORMAL LOW (ref 0.3–1.2)
Total Protein: 6.5 g/dL (ref 6.0–8.3)

## 2013-08-30 LAB — PROTEIN / CREATININE RATIO, URINE
CREATININE, URINE: 150.31 mg/dL
PROTEIN CREATININE RATIO: 0.11 (ref 0.00–0.15)
TOTAL PROTEIN, URINE: 16.4 mg/dL

## 2013-08-30 LAB — URIC ACID: URIC ACID, SERUM: 4.2 mg/dL (ref 2.4–7.0)

## 2013-08-30 LAB — LACTATE DEHYDROGENASE: LDH: 197 U/L (ref 94–250)

## 2013-08-30 MED ORDER — ACETAMINOPHEN 325 MG PO TABS: 650.0000 mg | ORAL_TABLET | ORAL | Status: DC | PRN

## 2013-08-30 MED ORDER — CALCIUM CARBONATE ANTACID 500 MG PO CHEW
2.0000 | CHEWABLE_TABLET | ORAL | Status: DC | PRN
Start: 2013-08-30 — End: 2013-08-30
  Administered 2013-08-30: 200 mg via ORAL
  Filled 2013-08-30: qty 1

## 2013-08-30 MED ORDER — PRENATAL MULTIVITAMIN CH: 1.0000 | ORAL_TABLET | Freq: Every day | ORAL | Status: DC

## 2013-08-30 MED ORDER — DOCUSATE SODIUM 100 MG PO CAPS: 100.0000 mg | ORAL_CAPSULE | Freq: Every day | ORAL | Status: DC

## 2013-08-30 MED ORDER — GLYBURIDE 5 MG PO TABS: 5.0000 mg | ORAL_TABLET | Freq: Every day | ORAL | Status: DC

## 2013-08-30 MED ORDER — LACTATED RINGERS IV BOLUS (SEPSIS)
1000.0000 mL | Freq: Once | INTRAVENOUS | Status: AC
  Administered 2013-08-30: 1000 mL via INTRAVENOUS

## 2013-08-30 MED ORDER — ZOLPIDEM TARTRATE 5 MG PO TABS: 5.0000 mg | ORAL_TABLET | Freq: Every evening | ORAL | Status: DC | PRN

## 2013-08-30 NOTE — H&P (Signed)
Claire Rojas is November 08, 1980  No LMP recorded. Patient is a 4561w3d G2P1 With A2 GDM on glyburide who presented to the MAU c/o decreased fetal movements.  She also reported mild headache no visual changes. She denies pain, no vaginal bleeding No leaking of fluid.    Pregnancy complicated by A2 GDM on Glyburide Previous cesarean delivery Large for Gestational age fetus   PMHX  Past Medical History  Diagnosis Date  . Diabetes mellitus     gestational  . Glucose intolerance (pre-diabetes)   . Gestational diabetes     PSURG HX  Past Surgical History  Procedure Laterality Date  . Cesarean section  04/26/1998  . Cholecystectomy  06/26/1998  . Nasal polyp excision  08/13/1998  . Cesarean section    . Cholecystectomy      Fam HX  Family History  Problem Relation Age of Onset  . Hypertension Mother   . Hypertension Father   . Diabetes Mother   . Diabetes Maternal Grandmother   . Diabetes Maternal Grandfather     Soc HX  History   Social History  . Marital Status: Married    Spouse Name: N/A    Number of Children: N/A  . Years of Education: N/A   Occupational History  . Not on file.   Social History Main Topics  . Smoking status: Never Smoker   . Smokeless tobacco: Not on file  . Alcohol Use: No  . Drug Use: No  . Sexual Activity: Yes   Other Topics Concern  . Not on file   Social History Narrative   ** Merged History Encounter **        ROS  Review of Systems - Negative except headache  Physical Exam (by PA in MAU): Constitutional: She is oriented to person, place, and time. She appears well-developed and well-nourished. No distress.  HENT:  Head: Normocephalic and atraumatic.  Cardiovascular: Normal rate.  Respiratory: Effort normal.  GI: Soft. She exhibits no distension and no mass. There is no tenderness. There is no rebound and no guarding.  Musculoskeletal: She exhibits edema (1+ pitting edema to the shin).  Neurological: She is alert and  oriented to person, place, and time.  Reflex Scores:  Bicep reflexes are 3+ on the right side and 3+ on the left side.  Brachioradialis reflexes are 3+ on the right side and 3+ on the left side.  Patellar reflexes are 2+ on the right side and 2+ on the left side. 1 beat clonus  Skin: Skin is warm and dry. No erythema.  Psychiatric: She has a normal mood and affect.   Results for orders placed during the hospital encounter of 08/30/13 (from the past 24 hour(s))   CBC WITH DIFFERENTIAL Status: Abnormal    Collection Time    08/30/13 3:07 AM   Result  Value  Ref Range    WBC  10.8 (*)  4.0 - 10.5 K/uL    RBC  4.09  3.87 - 5.11 MIL/uL    Hemoglobin  8.4 (*)  12.0 - 15.0 g/dL    HCT  64.328.3 (*)  32.936.0 - 46.0 %    MCV  69.2 (*)  78.0 - 100.0 fL    MCH  20.5 (*)  26.0 - 34.0 pg    MCHC  29.7 (*)  30.0 - 36.0 g/dL    RDW  51.818.0 (*)  84.111.5 - 15.5 %    Platelets  345  150 - 400 K/uL  Neutrophils Relative %  70  43 - 77 %    Lymphocytes Relative  23  12 - 46 %    Monocytes Relative  7  3 - 12 %    Eosinophils Relative  0  0 - 5 %    Basophils Relative  0  0 - 1 %    Band Neutrophils  0  0 - 10 %    Metamyelocytes Relative  0     Myelocytes  0     Promyelocytes Absolute  0     Blasts  0     nRBC  0  0 /100 WBC    Neutro Abs  7.5  1.7 - 7.7 K/uL    Lymphs Abs  2.5  0.7 - 4.0 K/uL    Monocytes Absolute  0.8  0.1 - 1.0 K/uL    Eosinophils Absolute  0.0  0.0 - 0.7 K/uL    Basophils Absolute  0.0  0.0 - 0.1 K/uL    RBC Morphology  POLYCHROMASIA PRESENT    COMPREHENSIVE METABOLIC PANEL Status: Abnormal    Collection Time    08/30/13 3:07 AM   Result  Value  Ref Range    Sodium  137  137 - 147 mEq/L    Potassium  4.1  3.7 - 5.3 mEq/L    Chloride  103  96 - 112 mEq/L    CO2  19  19 - 32 mEq/L    Glucose, Bld  103 (*)  70 - 99 mg/dL    BUN  7  6 - 23 mg/dL    Creatinine, Ser  1.61  0.50 - 1.10 mg/dL    Calcium  9.3  8.4 - 10.5 mg/dL    Total Protein  6.5  6.0 - 8.3 g/dL    Albumin  2.3  (*)  3.5 - 5.2 g/dL    AST  16  0 - 37 U/L    ALT  10  0 - 35 U/L    Alkaline Phosphatase  281 (*)  39 - 117 U/L    Total Bilirubin  <0.2 (*)  0.3 - 1.2 mg/dL    GFR calc non Af Amer  >90  >90 mL/min    GFR calc Af Amer  >90  >90 mL/min    Anion gap  15  5 - 15   URIC ACID Status: None    Collection Time    08/30/13 3:07 AM   Result  Value  Ref Range    Uric Acid, Serum  4.2  2.4 - 7.0 mg/dL   LACTATE DEHYDROGENASE Status: None    Collection Time    08/30/13 3:07 AM   Result  Value  Ref Range    LDH  197  94 - 250 U/L   URINALYSIS, ROUTINE W REFLEX MICROSCOPIC Status: None    Collection Time    08/30/13 3:10 AM   Result  Value  Ref Range    Color, Urine  YELLOW  YELLOW    APPearance  CLEAR  CLEAR    Specific Gravity, Urine  1.015  1.005 - 1.030    pH  6.0  5.0 - 8.0    Glucose, UA  NEGATIVE  NEGATIVE mg/dL    Hgb urine dipstick  NEGATIVE  NEGATIVE    Bilirubin Urine  NEGATIVE  NEGATIVE    Ketones, ur  NEGATIVE  NEGATIVE mg/dL    Protein, ur  NEGATIVE  NEGATIVE mg/dL    Urobilinogen, UA  0.2  0.0 - 1.0 mg/dL    Nitrite  NEGATIVE  NEGATIVE    Leukocytes, UA  NEGATIVE  NEGATIVE   PROTEIN / CREATININE RATIO, URINE Status: None    Collection Time    08/30/13 3:10 AM   Result  Value  Ref Range    Creatinine, Urine  150.31     Total Protein, Urine  16.4     PROTEIN CREATININE RATIO  0.11  0.00 - 0.15     Assessment: IUP [redacted]w[redacted]d with A2 GDM, decreased fetal movements. With reviewed of the NST  There was a question about variable decels, doesn't seem to be variable decels but rather a change in baseline from 150 to 120s, variability is good, accels.   Plan: 23 hr observation in antepartum with continuous monitoring          Will keep only on clear thin liquids pending a MFM consult / BPP for am  Claire Rojas, Sanjuana Mae

## 2013-08-30 NOTE — MAU Note (Signed)
Decreased fetal movement 

## 2013-08-30 NOTE — Discharge Summary (Signed)
[name] is a 33 year old gravida 2 para 1001 at 5538 weeks and 3 days estimated gestational age who was admitted for 23 hour FOR evaluation of decreased fetal movement, headache, and swelling. The patient's blood pressure throughout her admission was normal. All of her lab work was negative for evidence of preeclampsia. Her biophysical profile on the morning of 08/30/2013 was 8 out of 8. She was discharged home on 08/30/2013. She is scheduled for repeat cesarean section on Monday, 09/03/2013. She will return for her preoperative visit on 08/31/2013.

## 2013-08-30 NOTE — MAU Provider Note (Signed)
Reviewed case with PA and I agree with above

## 2013-08-30 NOTE — MAU Provider Note (Signed)
History     CSN: [id number]  Arrival date and time: 08/30/13 0106   First Provider Initiated Contact with Patient 08/30/13 0148      Chief Complaint  Patient presents with  . Decreased Fetal Movement   HPI Claire Rojas is a 33 y.o. G2P1001 at [redacted]w[redacted]d with GDM on glyburide who presents to MAU today with decreased fetal movement. She states C/S scheduled on Monday for LGA. The patient states decreased fetal movement today. She has occasional mild contractions. She denies vaginal bleeding, LOF today. She denies issues with HTN in this pregnancy. She states mild headache today, but denies blurred vision or floaters. She does have mild bilateral LE edema.   OB History   Grav Para Term Preterm Abortions TAB SAB Ect Mult Living   2 1 1       1       Past Medical History  Diagnosis Date  . Diabetes mellitus     gestational  . Glucose intolerance (pre-diabetes)   . Gestational diabetes     Past Surgical History  Procedure Laterality Date  . Cesarean section  04/26/1998  . Cholecystectomy  06/26/1998  . Nasal polyp excision  08/13/1998  . Cesarean section    . Cholecystectomy      Family History  Problem Relation Age of Onset  . Hypertension Mother   . Hypertension Father   . Diabetes Mother   . Diabetes Maternal Grandmother   . Diabetes Maternal Grandfather     History  Substance Use Topics  . Smoking status: Never Smoker   . Smokeless tobacco: Not on file  . Alcohol Use: No    Allergies: No Known Allergies  Prescriptions prior to admission  Medication Sig Dispense Refill  . acetaminophen (TYLENOL) 325 MG tablet Take 650 mg by mouth every 6 (six) hours as needed.      . calcium carbonate (TUMS - DOSED IN MG ELEMENTAL CALCIUM) 500 MG chewable tablet Chew 1 tablet by mouth as needed for indigestion or heartburn.      . glyBURIDE (DIABETA) 2.5 MG tablet Take 5 mg by mouth daily with supper.       . Prenatal Vit-Fe Fumarate-FA (PRENATAL MULTIVITAMIN) TABS tablet Take  1 tablet by mouth daily at 12 noon.        Review of Systems  Constitutional: Negative for fever and malaise/fatigue.  Eyes: Negative for blurred vision.       Neg - floaters  Gastrointestinal: Negative for nausea, vomiting, abdominal pain, diarrhea and constipation.  Genitourinary: Negative for dysuria, urgency and frequency.       Neg - vaginal bleeding, discharge, LOF  Neurological: Positive for headaches.   Physical Exam   Blood pressure 127/61, pulse 74, temperature 97.5 F (36.4 C), temperature source Oral, resp. rate 20, height 5\' 1"  (1.549 m), weight 231 lb (104.781 kg), SpO2 100.00%.  Physical Exam  Constitutional: She is oriented to person, place, and time. She appears well-developed and well-nourished. No distress.  HENT:  Head: Normocephalic and atraumatic.  Cardiovascular: Normal rate.   Respiratory: Effort normal.  GI: Soft. She exhibits no distension and no mass. There is no tenderness. There is no rebound and no guarding.  Musculoskeletal: She exhibits edema (1+ pitting edema to the shin).  Neurological: She is alert and oriented to person, place, and time.  Reflex Scores:      Bicep reflexes are 3+ on the right side and 3+ on the left side.      Brachioradialis  reflexes are 3+ on the right side and 3+ on the left side.      Patellar reflexes are 2+ on the right side and 2+ on the left side. 1 beat clonus  Skin: Skin is warm and dry. No erythema.  Psychiatric: She has a normal mood and affect.   Results for orders placed during the hospital encounter of 08/30/13 (from the past 24 hour(s))  CBC WITH DIFFERENTIAL     Status: Abnormal   Collection Time    08/30/13  3:07 AM      Result Value Ref Range   WBC 10.8 (*) 4.0 - 10.5 K/uL   RBC 4.09  3.87 - 5.11 MIL/uL   Hemoglobin 8.4 (*) 12.0 - 15.0 g/dL   HCT 40.9 (*) 81.1 - 91.4 %   MCV 69.2 (*) 78.0 - 100.0 fL   MCH 20.5 (*) 26.0 - 34.0 pg   MCHC 29.7 (*) 30.0 - 36.0 g/dL   RDW 78.2 (*) 95.6 - 21.3 %    Platelets 345  150 - 400 K/uL   Neutrophils Relative % 70  43 - 77 %   Lymphocytes Relative 23  12 - 46 %   Monocytes Relative 7  3 - 12 %   Eosinophils Relative 0  0 - 5 %   Basophils Relative 0  0 - 1 %   Band Neutrophils 0  0 - 10 %   Metamyelocytes Relative 0     Myelocytes 0     Promyelocytes Absolute 0     Blasts 0     nRBC 0  0 /100 WBC   Neutro Abs 7.5  1.7 - 7.7 K/uL   Lymphs Abs 2.5  0.7 - 4.0 K/uL   Monocytes Absolute 0.8  0.1 - 1.0 K/uL   Eosinophils Absolute 0.0  0.0 - 0.7 K/uL   Basophils Absolute 0.0  0.0 - 0.1 K/uL   RBC Morphology POLYCHROMASIA PRESENT    COMPREHENSIVE METABOLIC PANEL     Status: Abnormal   Collection Time    08/30/13  3:07 AM      Result Value Ref Range   Sodium 137  137 - 147 mEq/L   Potassium 4.1  3.7 - 5.3 mEq/L   Chloride 103  96 - 112 mEq/L   CO2 19  19 - 32 mEq/L   Glucose, Bld 103 (*) 70 - 99 mg/dL   BUN 7  6 - 23 mg/dL   Creatinine, Ser 0.86  0.50 - 1.10 mg/dL   Calcium 9.3  8.4 - 57.8 mg/dL   Total Protein 6.5  6.0 - 8.3 g/dL   Albumin 2.3 (*) 3.5 - 5.2 g/dL   AST 16  0 - 37 U/L   ALT 10  0 - 35 U/L   Alkaline Phosphatase 281 (*) 39 - 117 U/L   Total Bilirubin <0.2 (*) 0.3 - 1.2 mg/dL   GFR calc non Af Amer >90  >90 mL/min   GFR calc Af Amer >90  >90 mL/min   Anion gap 15  5 - 15  URIC ACID     Status: None   Collection Time    08/30/13  3:07 AM      Result Value Ref Range   Uric Acid, Serum 4.2  2.4 - 7.0 mg/dL  LACTATE DEHYDROGENASE     Status: None   Collection Time    08/30/13  3:07 AM      Result Value Ref Range   LDH 197  94 - 250 U/L  URINALYSIS, ROUTINE W REFLEX MICROSCOPIC     Status: None   Collection Time    08/30/13  3:10 AM      Result Value Ref Range   Color, Urine YELLOW  YELLOW   APPearance CLEAR  CLEAR   Specific Gravity, Urine 1.015  1.005 - 1.030   pH 6.0  5.0 - 8.0   Glucose, UA NEGATIVE  NEGATIVE mg/dL   Hgb urine dipstick NEGATIVE  NEGATIVE   Bilirubin Urine NEGATIVE  NEGATIVE   Ketones, ur  NEGATIVE  NEGATIVE mg/dL   Protein, ur NEGATIVE  NEGATIVE mg/dL   Urobilinogen, UA 0.2  0.0 - 1.0 mg/dL   Nitrite NEGATIVE  NEGATIVE   Leukocytes, UA NEGATIVE  NEGATIVE  PROTEIN / CREATININE RATIO, URINE     Status: None   Collection Time    08/30/13  3:10 AM      Result Value Ref Range   Creatinine, Urine 150.31     Total Protein, Urine 16.4     PROTEIN CREATININE RATIO 0.11  0.00 - 0.15     Patient Vitals for the past 24 hrs:  BP Temp Temp src Pulse Resp SpO2 Height Weight  08/30/13 0432 - - - 74 - 100 % - -  08/30/13 0427 - - - 82 - 100 % - -  08/30/13 0423 - - - 97 - 100 % - -  08/30/13 0422 - - - 93 - 100 % - -  08/30/13 0420 - - - 82 - 100 % - -  08/30/13 0415 - - - 78 - 100 % - -  08/30/13 0410 - - - 74 - 100 % - -  08/30/13 0401 127/61 mmHg - - 72 - - - -  08/30/13 0357 - - - 85 - 98 % - -  08/30/13 0352 - - - 72 - 100 % - -  08/30/13 0347 - - - 73 - 100 % - -  08/30/13 0342 - - - 72 - 100 % - -  08/30/13 0337 - - - 74 - 100 % - -  08/30/13 0332 - - - 75 - 100 % - -  08/30/13 0223 126/66 mmHg - - 85 - - - -  08/30/13 0150 136/84 mmHg - - 74 - - - -  08/30/13 0128 127/85 mmHg 97.5 F (36.4 C) Oral 69 20 100 % 5\' 1"  (1.549 m) 231 lb (104.781 kg)   Fetal Monitoring: Baseline: 120 bpm, moderate variability, + accelerations, ? variable decelerations Contractions: irregular, occasional with moderate UI Patient turned onto her side, decelerations stopped Patient has been having mild irritability only since IV fluids given  MAU Course  Procedures None  MDM Discussed with Dr. Mora ApplPinn. She has reviewed the complete NST. Get CBC, CMP, Uric acid, LDH and urine protein/creatinine ratio today.  Dr. Mora ApplPinn recommends 1 liter LR bolus. Fluids given.  Discussed lab result with Dr. Mora ApplPinn. Admit for 23 hour observation and possible BPP in the morning Assessment and Plan  A: SIUP at 730w3d GDM Non-reassuring NST  P: Admit to Antenatal for 23 hour observation and possible BPP in  the morning  Freddi StarrJulie N Ethier, PA-C  08/30/2013, 4:39 AM

## 2013-08-31 ENCOUNTER — Encounter (HOSPITAL_COMMUNITY): Payer: Self-pay

## 2013-08-31 ENCOUNTER — Encounter (HOSPITAL_COMMUNITY): Payer: BC Managed Care – PPO | Admitting: Anesthesiology

## 2013-08-31 ENCOUNTER — Inpatient Hospital Stay (HOSPITAL_COMMUNITY)
Admission: AD | Admit: 2013-08-31 | Discharge: 2013-09-03 | DRG: 765 | Disposition: A | Discharge status: Home / Self Care | Payer: BC Managed Care – PPO | Source: Ambulatory Visit | Attending: Obstetrics and Gynecology | Admitting: Obstetrics and Gynecology

## 2013-08-31 ENCOUNTER — Encounter (HOSPITAL_COMMUNITY): Payer: Self-pay | Admitting: Anesthesiology

## 2013-08-31 ENCOUNTER — Encounter (HOSPITAL_COMMUNITY)
Admission: AD | Disposition: A | Discharge status: Home / Self Care | Payer: Self-pay | Source: Ambulatory Visit | Attending: Obstetrics and Gynecology

## 2013-08-31 ENCOUNTER — Encounter (HOSPITAL_COMMUNITY)
Admission: RE | Admit: 2013-08-31 | Discharge: 2013-08-31 | Disposition: A | Discharge status: Home / Self Care | Payer: BC Managed Care – PPO | Source: Ambulatory Visit | Attending: Obstetrics and Gynecology | Admitting: Obstetrics and Gynecology

## 2013-08-31 ENCOUNTER — Inpatient Hospital Stay (HOSPITAL_COMMUNITY): Payer: BC Managed Care – PPO | Admitting: Anesthesiology

## 2013-08-31 ENCOUNTER — Encounter (HOSPITAL_COMMUNITY): Payer: Self-pay | Admitting: *Deleted

## 2013-08-31 DIAGNOSIS — O99214 Obesity complicating childbirth: Secondary | ICD-10-CM

## 2013-08-31 DIAGNOSIS — O34219 Maternal care for unspecified type scar from previous cesarean delivery: Principal | ICD-10-CM | POA: Diagnosis present

## 2013-08-31 DIAGNOSIS — E669 Obesity, unspecified: Secondary | ICD-10-CM | POA: Diagnosis present

## 2013-08-31 DIAGNOSIS — Z3490 Encounter for supervision of normal pregnancy, unspecified, unspecified trimester: Secondary | ICD-10-CM

## 2013-08-31 DIAGNOSIS — O139 Gestational [pregnancy-induced] hypertension without significant proteinuria, unspecified trimester: Secondary | ICD-10-CM | POA: Diagnosis present

## 2013-08-31 DIAGNOSIS — Z6841 Body Mass Index (BMI) 40.0 and over, adult: Secondary | ICD-10-CM

## 2013-08-31 DIAGNOSIS — D649 Anemia, unspecified: Secondary | ICD-10-CM | POA: Diagnosis present

## 2013-08-31 DIAGNOSIS — O9902 Anemia complicating childbirth: Secondary | ICD-10-CM | POA: Diagnosis present

## 2013-08-31 DIAGNOSIS — Z3480 Encounter for supervision of other normal pregnancy, unspecified trimester: Secondary | ICD-10-CM

## 2013-08-31 DIAGNOSIS — O99814 Abnormal glucose complicating childbirth: Secondary | ICD-10-CM | POA: Diagnosis present

## 2013-08-31 HISTORY — DX: Gastro-esophageal reflux disease without esophagitis: K21.9

## 2013-08-31 HISTORY — DX: Anemia, unspecified: D64.9

## 2013-08-31 LAB — CBC
HEMATOCRIT: 29.4 % — AB (ref 36.0–46.0)
Hemoglobin: 8.6 g/dL — ABNORMAL LOW (ref 12.0–15.0)
MCH: 20.3 pg — ABNORMAL LOW (ref 26.0–34.0)
MCHC: 29.3 g/dL — ABNORMAL LOW (ref 30.0–36.0)
MCV: 69.5 fL — AB (ref 78.0–100.0)
Platelets: 358 10*3/uL (ref 150–400)
RBC: 4.23 MIL/uL (ref 3.87–5.11)
RDW: 18.1 % — AB (ref 11.5–15.5)
WBC: 9.7 10*3/uL (ref 4.0–10.5)

## 2013-08-31 LAB — TYPE AND SCREEN
ABO/RH(D): A POS
Antibody Screen: NEGATIVE

## 2013-08-31 LAB — ABO/RH: ABO/RH(D): A POS

## 2013-08-31 LAB — RPR

## 2013-08-31 LAB — POCT FERN TEST

## 2013-08-31 LAB — BASIC METABOLIC PANEL
Anion gap: 13 (ref 5–15)
BUN: 6 mg/dL (ref 6–23)
CHLORIDE: 103 meq/L (ref 96–112)
CO2: 20 mEq/L (ref 19–32)
CREATININE: 0.77 mg/dL (ref 0.50–1.10)
Calcium: 9.2 mg/dL (ref 8.4–10.5)
GFR calc Af Amer: >90 mL/min (ref >90)
GFR calc non Af Amer: >90 mL/min (ref >90)
Glucose, Bld: 65 mg/dL — ABNORMAL LOW (ref 70–99)
Potassium: 4.4 mEq/L (ref 3.7–5.3)
Sodium: 136 mEq/L — ABNORMAL LOW (ref 137–147)

## 2013-08-31 LAB — GLUCOSE, CAPILLARY: GLUCOSE-CAPILLARY: 119 mg/dL — AB (ref 70–99)

## 2013-08-31 SURGERY — Surgical Case
Anesthesia: Spinal

## 2013-08-31 MED ORDER — FENTANYL CITRATE 0.05 MG/ML IJ SOLN
INTRAMUSCULAR | Status: AC
  Filled 2013-08-31: qty 2

## 2013-08-31 MED ORDER — WITCH HAZEL-GLYCERIN EX PADS: 1.0000 "application " | MEDICATED_PAD | CUTANEOUS | Status: DC | PRN

## 2013-08-31 MED ORDER — SENNOSIDES-DOCUSATE SODIUM 8.6-50 MG PO TABS
2.0000 | ORAL_TABLET | ORAL | Status: DC
  Administered 2013-08-31 – 2013-09-02 (×3): 2 via ORAL
  Filled 2013-08-31 (×3): qty 2

## 2013-08-31 MED ORDER — FENTANYL CITRATE 0.05 MG/ML IJ SOLN
25.0000 ug | INTRAMUSCULAR | Status: DC | PRN
Start: 2013-08-31 — End: 2013-08-31
  Administered 2013-08-31 (×2): 50 ug via INTRAVENOUS

## 2013-08-31 MED ORDER — SIMETHICONE 80 MG PO CHEW
80.0000 mg | CHEWABLE_TABLET | ORAL | Status: DC
  Administered 2013-08-31 – 2013-09-02 (×3): 80 mg via ORAL
  Filled 2013-08-31 (×3): qty 1

## 2013-08-31 MED ORDER — DIPHENHYDRAMINE HCL 25 MG PO CAPS
25.0000 mg | ORAL_CAPSULE | ORAL | Status: DC | PRN
  Administered 2013-08-31 – 2013-09-01 (×2): 25 mg via ORAL
  Filled 2013-08-31 (×2): qty 1

## 2013-08-31 MED ORDER — NALBUPHINE HCL 10 MG/ML IJ SOLN
5.0000 mg | INTRAMUSCULAR | Status: DC | PRN
  Administered 2013-09-01: 10 mg via SUBCUTANEOUS
  Filled 2013-08-31: qty 1

## 2013-08-31 MED ORDER — LACTATED RINGERS IV SOLN: 500.0000 mL | INTRAVENOUS | Status: DC | PRN

## 2013-08-31 MED ORDER — MORPHINE SULFATE (PF) 0.5 MG/ML IJ SOLN
INTRAMUSCULAR | Status: DC | PRN
  Administered 2013-08-31: .15 mg via EPIDURAL

## 2013-08-31 MED ORDER — SCOPOLAMINE 1 MG/3DAYS TD PT72
1.0000 | MEDICATED_PATCH | Freq: Once | TRANSDERMAL | Status: DC
  Administered 2013-08-31: 1.5 mg via TRANSDERMAL

## 2013-08-31 MED ORDER — IBUPROFEN 600 MG PO TABS
600.0000 mg | ORAL_TABLET | Freq: Four times a day (QID) | ORAL | Status: DC
  Administered 2013-09-01 – 2013-09-03 (×10): 600 mg via ORAL
  Filled 2013-08-31 (×10): qty 1

## 2013-08-31 MED ORDER — ZOLPIDEM TARTRATE 5 MG PO TABS: 5.0000 mg | ORAL_TABLET | Freq: Every evening | ORAL | Status: DC | PRN

## 2013-08-31 MED ORDER — ONDANSETRON HCL 4 MG/2ML IJ SOLN: 4.0000 mg | Freq: Three times a day (TID) | INTRAMUSCULAR | Status: DC | PRN

## 2013-08-31 MED ORDER — OXYCODONE-ACETAMINOPHEN 5-325 MG PO TABS
1.0000 | ORAL_TABLET | ORAL | Status: DC | PRN
  Administered 2013-09-01 – 2013-09-03 (×9): 1 via ORAL
  Filled 2013-08-31 (×9): qty 1

## 2013-08-31 MED ORDER — PHENYLEPHRINE 8 MG IN D5W 100 ML (0.08MG/ML) PREMIX OPTIME
INJECTION | INTRAVENOUS | Status: AC
  Filled 2013-08-31: qty 100

## 2013-08-31 MED ORDER — DIPHENHYDRAMINE HCL 50 MG/ML IJ SOLN: 12.5000 mg | INTRAMUSCULAR | Status: DC | PRN

## 2013-08-31 MED ORDER — PRENATAL MULTIVITAMIN CH
1.0000 | ORAL_TABLET | Freq: Every day | ORAL | Status: DC
  Administered 2013-09-01 – 2013-09-03 (×3): 1 via ORAL
  Filled 2013-08-31 (×3): qty 1

## 2013-08-31 MED ORDER — LIDOCAINE HCL (PF) 1 % IJ SOLN: 30.0000 mL | INTRAMUSCULAR | Status: DC | PRN

## 2013-08-31 MED ORDER — ONDANSETRON HCL 4 MG/2ML IJ SOLN
INTRAMUSCULAR | Status: AC
  Filled 2013-08-31: qty 2

## 2013-08-31 MED ORDER — IBUPROFEN 600 MG PO TABS: 600.0000 mg | ORAL_TABLET | Freq: Four times a day (QID) | ORAL | Status: DC | PRN

## 2013-08-31 MED ORDER — ONDANSETRON HCL 4 MG/2ML IJ SOLN: 4.0000 mg | Freq: Four times a day (QID) | INTRAMUSCULAR | Status: DC | PRN

## 2013-08-31 MED ORDER — FLEET ENEMA 7-19 GM/118ML RE ENEM: 1.0000 | ENEMA | RECTAL | Status: DC | PRN

## 2013-08-31 MED ORDER — TETANUS-DIPHTH-ACELL PERTUSSIS 5-2.5-18.5 LF-MCG/0.5 IM SUSP
0.5000 mL | Freq: Once | INTRAMUSCULAR | Status: AC
  Administered 2013-09-03: 0.5 mL via INTRAMUSCULAR
  Filled 2013-08-31: qty 0.5

## 2013-08-31 MED ORDER — MORPHINE SULFATE 0.5 MG/ML IJ SOLN
INTRAMUSCULAR | Status: AC
  Filled 2013-08-31: qty 10

## 2013-08-31 MED ORDER — LACTATED RINGERS IV SOLN
INTRAVENOUS | Status: DC
  Administered 2013-08-31 (×2): via INTRAVENOUS

## 2013-08-31 MED ORDER — ONDANSETRON HCL 4 MG/2ML IJ SOLN: 4.0000 mg | INTRAMUSCULAR | Status: DC | PRN

## 2013-08-31 MED ORDER — CITRIC ACID-SODIUM CITRATE 334-500 MG/5ML PO SOLN
30.0000 mL | ORAL | Status: DC | PRN
  Administered 2013-08-31: 30 mL via ORAL
  Filled 2013-08-31: qty 15

## 2013-08-31 MED ORDER — ACETAMINOPHEN 325 MG PO TABS
650.0000 mg | ORAL_TABLET | ORAL | Status: DC | PRN
Start: 2013-08-31 — End: 2013-08-31

## 2013-08-31 MED ORDER — PHENYLEPHRINE 8 MG IN D5W 100 ML (0.08MG/ML) PREMIX OPTIME
INJECTION | INTRAVENOUS | Status: DC | PRN
  Administered 2013-08-31: 60 ug/min via INTRAVENOUS

## 2013-08-31 MED ORDER — SODIUM CHLORIDE 0.9 % IJ SOLN: 3.0000 mL | INTRAMUSCULAR | Status: DC | PRN

## 2013-08-31 MED ORDER — OXYCODONE-ACETAMINOPHEN 5-325 MG PO TABS: 1.0000 | ORAL_TABLET | ORAL | Status: DC | PRN

## 2013-08-31 MED ORDER — MEPERIDINE HCL 25 MG/ML IJ SOLN
6.2500 mg | INTRAMUSCULAR | Status: DC | PRN
  Administered 2013-08-31: 6.25 mg via INTRAVENOUS

## 2013-08-31 MED ORDER — MEASLES, MUMPS & RUBELLA VAC ~~LOC~~ INJ
0.5000 mL | INJECTION | Freq: Once | SUBCUTANEOUS | Status: DC
  Filled 2013-08-31: qty 0.5

## 2013-08-31 MED ORDER — KETOROLAC TROMETHAMINE 30 MG/ML IJ SOLN
INTRAMUSCULAR | Status: AC
  Filled 2013-08-31: qty 1

## 2013-08-31 MED ORDER — DIBUCAINE 1 % RE OINT: 1.0000 "application " | TOPICAL_OINTMENT | RECTAL | Status: DC | PRN

## 2013-08-31 MED ORDER — OXYTOCIN 40 UNITS IN LACTATED RINGERS INFUSION - SIMPLE MED: 62.5000 mL/h | INTRAVENOUS | Status: AC

## 2013-08-31 MED ORDER — OXYTOCIN 10 UNIT/ML IJ SOLN
INTRAMUSCULAR | Status: AC
  Filled 2013-08-31: qty 4

## 2013-08-31 MED ORDER — CEFAZOLIN SODIUM-DEXTROSE 2-3 GM-% IV SOLR
INTRAVENOUS | Status: DC | PRN
  Administered 2013-08-31: 2 g via INTRAVENOUS

## 2013-08-31 MED ORDER — METOCLOPRAMIDE HCL 5 MG/ML IJ SOLN: 10.0000 mg | Freq: Three times a day (TID) | INTRAMUSCULAR | Status: DC | PRN

## 2013-08-31 MED ORDER — MEPERIDINE HCL 25 MG/ML IJ SOLN
INTRAMUSCULAR | Status: AC
  Filled 2013-08-31: qty 1

## 2013-08-31 MED ORDER — BUPIVACAINE IN DEXTROSE 0.75-8.25 % IT SOLN
INTRATHECAL | Status: DC | PRN
  Administered 2013-08-31: 1.4 mL via INTRATHECAL

## 2013-08-31 MED ORDER — KETOROLAC TROMETHAMINE 30 MG/ML IJ SOLN
30.0000 mg | Freq: Four times a day (QID) | INTRAMUSCULAR | Status: DC | PRN
  Administered 2013-08-31: 30 mg via INTRAVENOUS

## 2013-08-31 MED ORDER — SCOPOLAMINE 1 MG/3DAYS TD PT72
MEDICATED_PATCH | TRANSDERMAL | Status: AC
  Filled 2013-08-31: qty 1

## 2013-08-31 MED ORDER — KETOROLAC TROMETHAMINE 30 MG/ML IJ SOLN
30.0000 mg | Freq: Four times a day (QID) | INTRAMUSCULAR | Status: DC | PRN
Start: 2013-08-31 — End: 2013-08-31

## 2013-08-31 MED ORDER — DIPHENHYDRAMINE HCL 50 MG/ML IJ SOLN: 25.0000 mg | INTRAMUSCULAR | Status: DC | PRN

## 2013-08-31 MED ORDER — FENTANYL CITRATE 0.05 MG/ML IJ SOLN
INTRAMUSCULAR | Status: DC | PRN
  Administered 2013-08-31: 25 ug via INTRATHECAL

## 2013-08-31 MED ORDER — OXYTOCIN BOLUS FROM INFUSION
500.0000 mL | INTRAVENOUS | Status: DC
Start: 2013-08-31 — End: 2013-08-31

## 2013-08-31 MED ORDER — OXYTOCIN 40 UNITS IN LACTATED RINGERS INFUSION - SIMPLE MED
62.5000 mL/h | INTRAVENOUS | Status: DC
Start: 2013-08-31 — End: 2013-08-31

## 2013-08-31 MED ORDER — ONDANSETRON HCL 4 MG/2ML IJ SOLN
INTRAMUSCULAR | Status: DC | PRN
  Administered 2013-08-31: 4 mg via INTRAVENOUS

## 2013-08-31 MED ORDER — NALBUPHINE HCL 10 MG/ML IJ SOLN
5.0000 mg | INTRAMUSCULAR | Status: DC | PRN
  Administered 2013-09-01 (×2): 10 mg via INTRAVENOUS
  Filled 2013-08-31 (×2): qty 1

## 2013-08-31 MED ORDER — NALOXONE HCL 1 MG/ML IJ SOLN: 1.0000 ug/kg/h | INTRAVENOUS | Status: DC | PRN

## 2013-08-31 MED ORDER — NALOXONE HCL 0.4 MG/ML IJ SOLN: 0.4000 mg | INTRAMUSCULAR | Status: DC | PRN

## 2013-08-31 MED ORDER — 0.9 % SODIUM CHLORIDE (POUR BTL) OPTIME
TOPICAL | Status: DC | PRN
  Administered 2013-08-31: 400 mL

## 2013-08-31 MED ORDER — LACTATED RINGERS IV SOLN
INTRAVENOUS | Status: DC | PRN
  Administered 2013-08-31: 19:00:00 via INTRAVENOUS

## 2013-08-31 MED ORDER — ONDANSETRON HCL 4 MG PO TABS: 4.0000 mg | ORAL_TABLET | ORAL | Status: DC | PRN

## 2013-08-31 MED ORDER — LACTATED RINGERS IV SOLN
INTRAVENOUS | Status: DC
  Administered 2013-08-31: 22:00:00 via INTRAVENOUS

## 2013-08-31 MED ORDER — OXYTOCIN 10 UNIT/ML IJ SOLN
40.0000 [IU] | INTRAVENOUS | Status: DC | PRN
  Administered 2013-08-31: 40 [IU] via INTRAVENOUS

## 2013-08-31 SURGICAL SUPPLY — 31 items
CLAMP CORD UMBIL (MISCELLANEOUS) IMPLANT
CLOTH BEACON ORANGE TIMEOUT ST (SAFETY) ×3 IMPLANT
CONTAINER PREFILL 10% NBF 15ML (MISCELLANEOUS) IMPLANT
DRAPE LG THREE QUARTER DISP (DRAPES) IMPLANT
DRSG OPSITE POSTOP 4X10 (GAUZE/BANDAGES/DRESSINGS) ×3 IMPLANT
DURAPREP 26ML APPLICATOR (WOUND CARE) ×3 IMPLANT
ELECT REM PT RETURN 9FT ADLT (ELECTROSURGICAL) ×3
ELECTRODE REM PT RTRN 9FT ADLT (ELECTROSURGICAL) ×1 IMPLANT
EXTRACTOR VACUUM M CUP 4 TUBE (SUCTIONS) IMPLANT
EXTRACTOR VACUUM M CUP 4' TUBE (SUCTIONS)
GLOVE ECLIPSE 7.0 STRL STRAW (GLOVE) ×6 IMPLANT
GOWN STRL REUS W/TWL LRG LVL3 (GOWN DISPOSABLE) ×6 IMPLANT
KIT ABG SYR 3ML LUER SLIP (SYRINGE) IMPLANT
NEEDLE HYPO 25X5/8 SAFETYGLIDE (NEEDLE) IMPLANT
NS IRRIG 1000ML POUR BTL (IV SOLUTION) ×3 IMPLANT
PACK C SECTION WH (CUSTOM PROCEDURE TRAY) ×3 IMPLANT
PAD OB MATERNITY 4.3X12.25 (PERSONAL CARE ITEMS) ×3 IMPLANT
RETRACTOR WND ALEXIS 25 LRG (MISCELLANEOUS) IMPLANT
RETRACTOR WOUND ALXS 34CM XLRG (MISCELLANEOUS) IMPLANT
RTRCTR WOUND ALEXIS 25CM LRG (MISCELLANEOUS) ×3
RTRCTR WOUND ALEXIS 34CM XLRG (MISCELLANEOUS)
STAPLER VISISTAT 35W (STAPLE) ×3 IMPLANT
SUT MNCRL 0 VIOLET CTX 36 (SUTURE) ×3 IMPLANT
SUT MON AB 2-0 CT1 27 (SUTURE) ×6 IMPLANT
SUT MONOCRYL 0 CTX 36 (SUTURE) ×6
SUT PLAIN 0 NONE (SUTURE) IMPLANT
SUT PLAIN 2 0 XLH (SUTURE) ×2 IMPLANT
SUT VICRYL 0 TIES 12 18 (SUTURE) ×3 IMPLANT
TOWEL OR 17X24 6PK STRL BLUE (TOWEL DISPOSABLE) ×3 IMPLANT
TRAY FOLEY CATH 14FR (SET/KITS/TRAYS/PACK) IMPLANT
WATER STERILE IRR 1000ML POUR (IV SOLUTION) ×3 IMPLANT

## 2013-08-31 NOTE — Anesthesia Procedure Notes (Signed)
Spinal  Patient location during procedure: OR Start time: 08/31/2013 6:14 PM Staffing Anesthesiologist: Zoye Chandra A. Performed by: anesthesiologist  Preanesthetic Checklist Completed: patient identified, site marked, surgical consent, pre-op evaluation, timeout performed, IV checked, risks and benefits discussed and monitors and equipment checked Spinal Block Patient position: sitting Prep: site prepped and draped and DuraPrep Patient monitoring: heart rate, cardiac monitor, continuous pulse ox and blood pressure Approach: midline Location: L4-5 Injection technique: single-shot Needle Needle type: Sprotte and Tuohy  Needle gauge: 24 G Needle length: 9 cm Needle insertion depth: 8 cm Assessment Sensory level: T4 Additional Notes Difficult due to MO and poor positioning. Patient tolerated procedure  well. Adequate sensory level.

## 2013-08-31 NOTE — Consult Note (Signed)
Neonatology Note:  Attendance at C-section:  I was asked by Dr. Anderson to attend this repeat C/S at term (previously scheduled for next week, but presented with SROM). The mother is a G2P1 A pos, GBS neg with GDM, on glyburide. ROM 5 hours prior to delivery, fluid clear. Infant vigorous with good spontaneous cry and tone. Needed only minimal bulb suctioning. Ap 9/9. Lungs clear to ausc in DR. To CN to care of Pediatrician.  Ahmir Bracken C. Haruka Kowaleski, MD  

## 2013-08-31 NOTE — MAU Note (Signed)
Had pre op done today.  Pt is scheduled for noon on Monday. Est fetal wt is 9lb.  ? srom at 1415, clear fluid, still coming.

## 2013-08-31 NOTE — H&P (Addendum)
Pt is a 33 y/o black female, G2P1001 who presents to the ER c/o SROM and painful ctxs. She is scheduled for a repeat C/S on Monday. PNC was complicated by GDM. She used Glyburide and had good control.  PMHX- See hollister PE: VSSAF        HEENT-wnl        ABD-gravid, palp ctxs        FHTs- reactive IMP/ IUP at term with ROM in labor         Previous C/S Plan/ Proceed with Repeat C/S

## 2013-08-31 NOTE — Progress Notes (Signed)
Fern positive. 

## 2013-08-31 NOTE — Transfer of Care (Signed)
Immediate Anesthesia Transfer of Care Note  Patient: Claire Rojas  Procedure(s) Performed: Procedure(s): CESAREAN SECTION (N/A)  Patient Location: PACU  Anesthesia Type:Spinal  Level of Consciousness: awake, alert  and oriented  Airway & Oxygen Therapy: Patient Spontanous Breathing  Post-op Assessment: Report given to PACU RN and Post -op Vital signs reviewed and stable  Post vital signs: Reviewed and stable  Complications: No apparent anesthesia complications

## 2013-08-31 NOTE — Anesthesia Preprocedure Evaluation (Deleted)
Anesthesia Evaluation  Patient identified by MRN, date of birth, ID band Patient awake    Reviewed: Allergy & Precautions, H&P , NPO status , Patient's Chart, lab work & pertinent test results  Airway Mallampati: III TM Distance: >3 FB Neck ROM: Full    Dental no notable dental hx. (+) Teeth Intact   Pulmonary neg pulmonary ROS,  breath sounds clear to auscultation  Pulmonary exam normal       Cardiovascular negative cardio ROS  Rhythm:Regular Rate:Normal     Neuro/Psych negative neurological ROS  negative psych ROS   GI/Hepatic Neg liver ROS, GERD-  Medicated and Controlled,  Endo/Other  diabetes, Well Controlled, GestationalMorbid obesity  Renal/GU negative Renal ROS  negative genitourinary   Musculoskeletal negative musculoskeletal ROS (+)   Abdominal (+) + obese,   Peds  Hematology  (+) anemia ,   Anesthesia Other Findings   Reproductive/Obstetrics (+) Pregnancy Previous C/Section                           Anesthesia Physical  Anesthesia Plan  ASA: III and emergent  Anesthesia Plan: Spinal   Post-op Pain Management:    Induction:   Airway Management Planned: LMA  Additional Equipment:   Intra-op Plan:   Post-operative Plan:   Informed Consent: I have reviewed the patients History and Physical, chart, labs and discussed the procedure including the risks, benefits and alternatives for the proposed anesthesia with the patient or authorized representative who has indicated his/her understanding and acceptance.     Plan Discussed with: Anesthesiologist, CRNA and Surgeon  Anesthesia Plan Comments:         Anesthesia Quick Evaluation   

## 2013-08-31 NOTE — Pre-Procedure Instructions (Signed)
Dr. Malen GauzeFoster made aware of Glucose 65 and Hgb 8.6 no new orders received at this time.

## 2013-08-31 NOTE — Patient Instructions (Signed)
Your procedure is scheduled on: Monday, September 03, 2013  [facility]: 10:45am  Pick up the phone at the desk and dial [phone].  Call this number if you have problems the morning of surgery: [phone].  Remember: Do NOT eat food: After midnight Sunday Do NOT drink clear liquids after: After 8:15 am Monday morning Take these medicines the morning of surgery with a SIP OF WATER: None  Do NOT wear jewelry (body piercing), metal hair clips/bobby pins, make-up, or nail polish. Do NOT wear lotions, powders, or perfumes.  You may wear deoderant. Do NOT shave for 48 hours prior to surgery. Do NOT bring valuables to the hospital. Contacts, dentures, or bridgework may not be worn into surgery. Leave suitcase in car.  After surgery it may be brought to your room.  For patients admitted to the hospital, checkout time is 11:00 AM the day of discharge.

## 2013-08-31 NOTE — Anesthesia Preprocedure Evaluation (Signed)
Anesthesia Evaluation  Patient identified by MRN, date of birth, ID band Patient awake    Reviewed: Allergy & Precautions, H&P , NPO status , Patient's Chart, lab work & pertinent test results  Airway Mallampati: III TM Distance: >3 FB Neck ROM: Full    Dental no notable dental hx. (+) Teeth Intact   Pulmonary neg pulmonary ROS,  breath sounds clear to auscultation  Pulmonary exam normal       Cardiovascular negative cardio ROS  Rhythm:Regular Rate:Normal     Neuro/Psych negative neurological ROS  negative psych ROS   GI/Hepatic Neg liver ROS, GERD-  Medicated and Controlled,  Endo/Other  diabetes, Well Controlled, GestationalMorbid obesity  Renal/GU negative Renal ROS  negative genitourinary   Musculoskeletal negative musculoskeletal ROS (+)   Abdominal (+) + obese,   Peds  Hematology  (+) anemia ,   Anesthesia Other Findings   Reproductive/Obstetrics (+) Pregnancy Previous C/Section                           Anesthesia Physical  Anesthesia Plan  ASA: III and emergent  Anesthesia Plan: Spinal   Post-op Pain Management:    Induction:   Airway Management Planned: LMA  Additional Equipment:   Intra-op Plan:   Post-operative Plan:   Informed Consent: I have reviewed the patients History and Physical, chart, labs and discussed the procedure including the risks, benefits and alternatives for the proposed anesthesia with the patient or authorized representative who has indicated his/her understanding and acceptance.     Plan Discussed with: Anesthesiologist, CRNA and Surgeon  Anesthesia Plan Comments:         Anesthesia Quick Evaluation

## 2013-09-01 ENCOUNTER — Encounter (HOSPITAL_COMMUNITY): Payer: Self-pay | Admitting: *Deleted

## 2013-09-01 LAB — CBC
HCT: 23.9 % — ABNORMAL LOW (ref 36.0–46.0)
Hemoglobin: 7 g/dL — ABNORMAL LOW (ref 12.0–15.0)
MCH: 20.3 pg — AB (ref 26.0–34.0)
MCHC: 29.3 g/dL — AB (ref 30.0–36.0)
MCV: 69.5 fL — ABNORMAL LOW (ref 78.0–100.0)
PLATELETS: 298 10*3/uL (ref 150–400)
RBC: 3.44 MIL/uL — ABNORMAL LOW (ref 3.87–5.11)
RDW: 18 % — ABNORMAL HIGH (ref 11.5–15.5)
WBC: 14 10*3/uL — ABNORMAL HIGH (ref 4.0–10.5)

## 2013-09-01 NOTE — Progress Notes (Signed)
Spoke with Dr. Dareen PianoAnderson regarding placenta, to be discarded after 24 hours per protocol. OR notified.

## 2013-09-01 NOTE — Progress Notes (Signed)
POD#1 Pt without complaints. States that lochia is mild. Adequate pain control. VSSAF IMP/ Doing well Plan/ routine care.

## 2013-09-01 NOTE — Op Note (Signed)
[name], Claire                 ACCOUNT NO.:  [id number]  MEDICAL RECORD NO.:  [id number]  LOCATION:  9104                          FACILITY:  [facility]  PHYSICIAN:  Malva LimesMark Chandni Gagan, M.D.    DATE OF BIRTH:  21-Oct-1980  DATE OF PROCEDURE:  08/31/2013 DATE OF DISCHARGE:                              OPERATIVE REPORT   PREOPERATIVE DIAGNOSES: 1. Intrauterine pregnancy at term. 2. Gestational hypertension. 3. Spontaneous rupture of membranes. 4. Active labor. 5. History of prior cesarean section, declines trial of labor after     cesarean section.  POSTOPERATIVE DIAGNOSES: 1. Intrauterine pregnancy at term. 2. Gestational hypertension. 3. Spontaneous rupture of membranes. 4. Active labor. 5. History of prior cesarean section, declines trial of labor after     cesarean section.  PROCEDURE:  Repeat low-transverse cesarean section.  SURGEON:  Malva LimesMark Junaid Wurzer, M.D.  ANESTHESIA:  Spinal.  ANTIBIOTICS:  Ancef 2 g.  DRAINS:  Foley bedside drainage.  ESTIMATED BLOOD LOSS:  900 mL.  SPECIMENS:  None.  COMPLICATIONS:  None.  FINDINGS:  The patient had normal fallopian tubes and ovaries bilaterally.  The uterine incision appeared to be well healed.  PROCEDURE:  The patient was taken to the operating room, where spinal anesthetic was administered by Dr. Malen GauzeFoster.  The patient was then placed in the dorsal supine position with a left lateral tilt.  She was prepped and draped in usual fashion for this procedure.  A Foley catheter was placed.  A Pfannenstiel incision was made through the previous scar.  On entering the abdominal cavity, the bladder flap was taken down with sharp dissection.  The uterine incision was made in the midline with Metzenbaum scissors and extended laterally with blunt dissection. Amniotic fluid was noted to be clear.  The infant was delivered in a vertex presentation.  On delivery of the head, the oropharynx and nostrils were bulb suctioned.  Nuchal cord x1 was  reduced.  The remaining infant was then delivered.  The cord was doubly clamped and cut and the infant handed to the awaiting NICU team.  The placenta was then manually removed.  Uterus was exteriorized.  The uterine cavity was wiped with a wet lap and examined.  The uterine incision was closed in a single layer of 0-Monocryl suture in a running, locking fashion.  The bladder flap was not repaired.  Uterus was placed back in the abdominal cavity.  Hemostasis was checked and felt to be adequate.  At this point, the parietal peritoneum and rectus muscles were reapproximated in the midline using 2-0 Monocryl in a running, locking fashion.  The fascia was then closed using 0-Monocryl suture in a running fashion.  The subcuticular tissue was irrigated and made hemostatic with the Bovie. It was then closed with interrupted 2-0 plain gut suture.  Stainless steel clips were used to close the skin.  The patient tolerated the procedure well.  She was taken to the recovery room in stable condition. Instrument and lap count was correct x3.          ______________________________ Malva LimesMark Venetta Knee, M.D.     MA/MEDQ  D:  08/31/2013  T:  09/01/2013  Job:  [id number]

## 2013-09-01 NOTE — Lactation Note (Signed)
This note was copied from the chart of Boy Lajean Silviusiffany Harcum. Lactation Consultation Note  Patient Name: Boy Lajean Silviusiffany Canizalez WJXBJ'YToday's Date: 09/01/2013 Reason for consult: Initial assessment of this mom and baby at 25 hours post-delivery.  This is mom's second baby but first time to breastfeed.  Mom has easily expressible colostrum and baby able to achieve deep areolar grasp and a few sucks but is falling asleep at this attempt, so mom wants to keep STS and try "on cue" later.  LC encouraged frequent STS, cue feedings on at least one breast and mom to request assistance as needed.Mom encouraged to feed baby 8-12 times/24 hours and with feeding cues. LC encouraged review of Baby and Me pp 9, 14 and 20-25 for STS and BF information. LC provided [employer] Resource brochure and reviewed [facility] services and list of community and web site resources.     Maternal Data Formula Feeding for Exclusion: No Infant to breast within first hour of birth: Yes (initial LATCh score=9 after delivery) Has patient been taught Hand Expression?: Yes (LC demonstrated) Does the patient have breastfeeding experience prior to this delivery?: No (mom did not breastfeed with her first)  Feeding Feeding Type: Breast Fed Length of feed: 1 min  LATCH Score/Interventions Latch: Grasps breast easily, tongue down, lips flanged, rhythmical sucking. (latches briefly, thenfalls asleep)  Audible Swallowing: A few with stimulation  Type of Nipple: Everted at rest and after stimulation  Comfort (Breast/Nipple): Soft / non-tender     Hold (Positioning): Assistance needed to correctly position infant at breast and maintain latch. Intervention(s): Breastfeeding basics reviewed;Support Pillows;Position options;Skin to skin (encouraged breast support with nipple tilt)  LATCH Score: 8 (LC observed but baby too sleepy to sustain feeding, became fussy then sound asleep STS)  Lactation Tools Discussed/Used   STS, cue feedings, hand expression  Consult  Status Consult Status: Follow-up Date: 09/02/13 Follow-up type: In-patient    Warrick ParisianBryant, Shelba Susi [facility] 09/01/2013, 8:29 PM

## 2013-09-01 NOTE — Anesthesia Postprocedure Evaluation (Signed)
Anesthesia Post Note  Patient: Claire Rojas  Procedure(s) Performed: Procedure(s) (LRB): CESAREAN SECTION (N/A)  Anesthesia type: Spinal  Patient location: Mother/Baby  Post pain: Pain level controlled  Post assessment: Post-op Vital signs reviewed  Last Vitals:  Filed Vitals:   09/01/13 0630  BP: 120/70  Pulse: 89  Temp: 36.7 C  Resp: 18    Post vital signs: Reviewed  Level of consciousness: awake  Complications: No apparent anesthesia complications

## 2013-09-02 NOTE — Anesthesia Postprocedure Evaluation (Signed)
Anesthesia Post Note  Patient: Belva Cromeiffany D Nanez  Procedure(s) Performed: Procedure(s) (LRB): CESAREAN SECTION (N/A)  Anesthesia type: Spinal  Patient location: PACU  Post pain: Pain level controlled  Post assessment: Post-op Vital signs reviewed  Last Vitals: BP 130/60  Pulse 90  Temp(Src) 36.7 C (Oral)  Resp 18  Ht 5' 0.5" (1.537 m)  Wt 230 lb (104.327 kg)  BMI 44.16 kg/m2  SpO2 97%  LMP 11/24/2012  Breastfeeding? Unknown  Post vital signs: Reviewed  Level of consciousness: sedated  Complications: No apparent anesthesia complications

## 2013-09-02 NOTE — Lactation Note (Signed)
This note was copied from the chart of Claire Rojas. Lactation Consultation Note  Patient Name: Claire Lajean Silviusiffany Venuti WUJWJ'XToday's Date: 09/02/2013 Reason for consult: Initial assessment Baby 51 hours of life. Mom reports baby sleepy and not wanted to nurse well since circumcision earlier today. Assisted mom to latch baby in cradle position on left breast. Mom's breast are filling. This is mom's first time breastfeeding a child. Enc mom to offer baby lots of STS and to nurse with cues and at least 8-12 times/24 hours. Mom able to hand express colostrum from both breasts. Enc mom to massage any first areas while baby nursing and direct toward nipple. Gave mom a hand pump with instructions.  Maternal Data Has patient been taught Hand Expression?: Yes Does the patient have breastfeeding experience prior to this delivery?: No  Feeding Feeding Type: Breast Fed Length of feed:  (LC assessed first 15 minutes of breastfeed. )  LATCH Score/Interventions Latch: Grasps breast easily, tongue down, lips flanged, rhythmical sucking.  Audible Swallowing: A few with stimulation  Type of Nipple: Everted at rest and after stimulation  Comfort (Breast/Nipple): Soft / non-tender     Hold (Positioning): Assistance needed to correctly position infant at breast and maintain latch. Intervention(s): Breastfeeding basics reviewed;Support Pillows;Position options  LATCH Score: 8  Lactation Tools Discussed/Used     Consult Status Consult Status: Follow-up Date: 09/03/13 Follow-up type: In-patient    Geralynn OchsWILLIARD, Prabhnoor Ellenberger 09/02/2013, 10:24 PM

## 2013-09-02 NOTE — Progress Notes (Signed)
POD#2  Pt with out c/o. Baby had circ done today. Lochia wnl. VSSAF HGB- 7.0 preop 8.6 IMP/ stable Plan/ Routine care.

## 2013-09-03 ENCOUNTER — Encounter (HOSPITAL_COMMUNITY): Admission: RE | Payer: Self-pay | Source: Ambulatory Visit

## 2013-09-03 ENCOUNTER — Encounter (HOSPITAL_COMMUNITY): Payer: Self-pay | Admitting: Obstetrics and Gynecology

## 2013-09-03 ENCOUNTER — Inpatient Hospital Stay (HOSPITAL_COMMUNITY)
Admission: RE | Admit: 2013-09-03 | Payer: BC Managed Care – PPO | Source: Ambulatory Visit | Admitting: Obstetrics and Gynecology

## 2013-09-03 SURGERY — Surgical Case
Anesthesia: Regional

## 2013-09-03 MED ORDER — OXYCODONE-ACETAMINOPHEN 5-325 MG PO TABS: 1.0000 | ORAL_TABLET | ORAL | Status: AC | PRN

## 2013-09-03 NOTE — Discharge Summary (Signed)
Obstetric Discharge Summary Reason for Admission: rupture of membranes Prenatal Procedures: ultrasound Intrapartum Procedures: cesarean: low cervical, transverse- repeat Postpartum Procedures: none Complications-Operative and Postpartum: none Hemoglobin  Date Value Ref Range Status  09/01/2013 7.0* 12.0 - 15.0 g/dL Final     DELTA CHECK NOTED     REPEATED TO VERIFY     HCT  Date Value Ref Range Status  09/01/2013 23.9* 36.0 - 46.0 % Final    Physical Exam:  General: alert and cooperative Lochia: appropriate Uterine Fundus: firm Incision: healing well, no significant drainage, no significant erythema DVT Evaluation: No evidence of DVT seen on physical exam.  Discharge Diagnoses: Term Pregnancy-delivered  Discharge Information: Date: 09/03/2013 Activity: pelvic rest Diet: routine Medications: PNV, Ibuprofen and Percocet Condition: stable Instructions: refer to practice specific booklet Discharge to: home Follow-up Information   Follow up with Levi AlandANDERSON,MARK E, MD In 4 weeks.   Specialty:  Obstetrics and Gynecology   Contact information:   [address] [place] [id number] [phone]       Newborn Data: Live born female  Birth Weight: 8 lb 14.7 oz (4046 g) APGAR: 9, 9  Home with mother.  Philip AspenCALLAHAN, Aster Eckrich 09/03/2013, 9:57 AM

## 2013-12-17 ENCOUNTER — Encounter (HOSPITAL_COMMUNITY): Payer: Self-pay | Admitting: Obstetrics and Gynecology

## 2015-12-16 ENCOUNTER — Other Ambulatory Visit (HOSPITAL_COMMUNITY)
Admission: RE | Admit: 2015-12-16 | Discharge: 2015-12-16 | Disposition: A | Discharge status: Home / Self Care | Payer: BLUE CROSS/BLUE SHIELD | Source: Ambulatory Visit | Attending: Family Medicine | Admitting: Family Medicine

## 2015-12-16 ENCOUNTER — Other Ambulatory Visit: Payer: Self-pay | Admitting: Family Medicine

## 2015-12-16 DIAGNOSIS — Z1151 Encounter for screening for human papillomavirus (HPV): Secondary | ICD-10-CM | POA: Diagnosis present

## 2015-12-16 DIAGNOSIS — Z124 Encounter for screening for malignant neoplasm of cervix: Secondary | ICD-10-CM | POA: Diagnosis present

## 2015-12-17 LAB — CYTOLOGY - PAP
Diagnosis: NEGATIVE
HPV (WINDOPATH): NOT DETECTED

## 2016-01-15 ENCOUNTER — Encounter: Payer: BLUE CROSS/BLUE SHIELD | Attending: Family Medicine | Admitting: Skilled Nursing Facility1

## 2016-01-15 ENCOUNTER — Encounter: Payer: Self-pay | Admitting: Skilled Nursing Facility1

## 2016-01-15 DIAGNOSIS — Z6841 Body Mass Index (BMI) 40.0 and over, adult: Secondary | ICD-10-CM | POA: Insufficient documentation

## 2016-01-15 DIAGNOSIS — E119 Type 2 diabetes mellitus without complications: Secondary | ICD-10-CM

## 2016-01-15 DIAGNOSIS — Z713 Dietary counseling and surveillance: Secondary | ICD-10-CM | POA: Insufficient documentation

## 2016-01-15 NOTE — Progress Notes (Signed)
Diabetes Self-Management Education  Visit Type: First/Initial  Appt. Start Time: 9:45 Appt. End Time: 10:45  01/15/2016  [name], identified by name and date of birth, is a 35 y.o. female with a diagnosis of Diabetes: Type 2.   ASSESSMENT  Height 5\' 2"  (1.575 m), weight 226 lb (102.5 kg), unknown if currently breastfeeding. Body mass index is 41.34 kg/m. 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 Pt states she avoids fruits.     Diabetes Self-Management Education - 01/15/16 0949      Visit Information   Visit Type First/Initial     Initial Visit   Diabetes Type Type 2   Are you currently following a meal plan? Yes   Are you taking your medications as prescribed? Not on Medications   Date Diagnosed November 2017     Psychosocial Assessment   Patient Belief/Attitude about Diabetes Motivated to manage diabetes     Pre-Education Assessment   Patient understands the diabetes disease and treatment process. Needs Instruction   Patient understands incorporating nutritional management into lifestyle. Needs Instruction   Patient undertands incorporating physical activity into lifestyle. Needs Instruction   Patient understands using medications safely. Needs Instruction   Patient understands monitoring blood glucose, interpreting and using results Needs Instruction   Patient understands prevention, detection, and treatment of acute complications. Needs Instruction   Patient understands prevention, detection, and treatment of chronic complications. Needs Instruction   Patient understands how to develop strategies to address psychosocial issues. Needs Instruction   Patient understands how to develop strategies to promote health/change behavior. Needs Instruction     Complications   Last HgB A1C per patient/outside source 7.4 %   How often do you check your blood sugar? 3-4 times/day   Have you had a dilated eye exam in the past 12 months? No   Have you had a dental exam in the past 12  months? No   Are you checking your feet? Yes   How many days per week are you checking your feet? 7     Dietary Intake   Breakfast Egg white omelet with veggies   Snack (morning) granola and protein bars   Lunch soup and salad----sandwich---pizza   Snack (afternoon) nuts   Dinner salmon---shrimp---rice---pizza----shrimp lettuce wrap----stuffed potatoes with rice   Snack (evening) cookies    Beverage(s) water, juice, gingerale, coffee     Exercise   Exercise Type Light (walking / raking leaves)   How many days per week to you exercise? 3   How many minutes per day do you exercise? 60   Total minutes per week of exercise 180     Patient Education   Previous Diabetes Education Yes (please comment)  gestational diabetes   Disease state  Definition of diabetes, type 1 and 2, and the diagnosis of diabetes   Nutrition management  Role of diet in the treatment of diabetes and the relationship between the three main macronutrients and blood glucose level;Food label reading, portion sizes and measuring food.;Carbohydrate counting;Reviewed blood glucose goals for pre and post meals and how to evaluate the patients' food intake on their blood glucose level.;Information on hints to eating out and maintain blood glucose control.   Physical activity and exercise  Role of exercise on diabetes management, blood pressure control and cardiac health.   Monitoring Purpose and frequency of SMBG.;Yearly dilated eye exam;Daily foot exams;Identified appropriate SMBG and/or A1C goals.   Chronic complications Assessed and discussed foot care and prevention of foot problems;Retinopathy and reason for yearly  dilated eye exams;Nephropathy, what it is, prevention of, the use of ACE, ARB's and early detection of through urine microalbumia.;Dental care   Psychosocial adjustment Role of stress on diabetes     Individualized Goals (developed by patient)   Nutrition Follow meal plan discussed;Adjust meds/carbs with  exercise as discussed   Physical Activity Exercise 3-5 times per week;30 minutes per day   Monitoring  test blood glucose pre and post meals as discussed;test my blood glucose as discussed     Post-Education Assessment   Patient understands the diabetes disease and treatment process. Demonstrates understanding / competency   Patient understands incorporating nutritional management into lifestyle. Demonstrates understanding / competency   Patient undertands incorporating physical activity into lifestyle. Demonstrates understanding / competency   Patient understands using medications safely. Demonstrates understanding / competency   Patient understands monitoring blood glucose, interpreting and using results Demonstrates understanding / competency   Patient understands prevention, detection, and treatment of acute complications. Demonstrates understanding / competency   Patient understands prevention, detection, and treatment of chronic complications. Demonstrates understanding / competency   Patient understands how to develop strategies to address psychosocial issues. Demonstrates understanding / competency   Patient understands how to develop strategies to promote health/change behavior. Demonstrates understanding / competency     Outcomes   Expected Outcomes Demonstrated interest in learning. Expect positive outcomes   Future DMSE PRN   Program Status Completed      Individualized Plan for Diabetes Self-Management Training:   Learning Objective:  Patient will have a greater understanding of diabetes self-management. Patient education plan is to attend individual and/or group sessions per assessed needs and concerns.   Plan:   Patient Instructions  -Always bring your meter with you everywhere you go -Always Properly dispose of your needles:  -Discard in a hard plastic/metal container with a lid (something the needle can't puncture)  -Write Do Not Recycle on the outside of the  container  -Example: A laundry detergent bottle -Never use the same needle more than once -Eat 2-3 carbohydrate choices for each meal and 1 for each snack -A meal: carbohydrates, protein, vegetable -A snack: A Fruit OR Vegetable AND Protein  -Try to be more active -Always pay attention to your body keeping watchful of possible low blood sugar (below 70) or high blood sugar (above 200)  -Before you eat in the morning: under 130 -2 hours after you eat: under 180    Expected Outcomes:  Demonstrated interest in learning. Expect positive outcomes  Education material provided: Living Well with Diabetes, Food label handouts, Meal plan card, My Plate, Snack sheet and Support group flyer  If problems or questions, patient to contact team via:  Phone  Future DSME appointment: PRN

## 2016-01-15 NOTE — Patient Instructions (Signed)
-  Always bring your meter with you everywhere you go -Always Properly dispose of your needles:  -Discard in a hard plastic/metal container with a lid (something the needle can't puncture)  -Write Do Not Recycle on the outside of the container  -Example: A laundry detergent bottle -Never use the same needle more than once -Eat 2-3 carbohydrate choices for each meal and 1 for each snack -A meal: carbohydrates, protein, vegetable -A snack: A Fruit OR Vegetable AND Protein  -Try to be more active -Always pay attention to your body keeping watchful of possible low blood sugar (below 70) or high blood sugar (above 200)  -Before you eat in the morning: under 130 -2 hours after you eat: under 180

## 2019-11-28 ENCOUNTER — Other Ambulatory Visit: Payer: Self-pay | Admitting: Internal Medicine

## 2019-11-28 DIAGNOSIS — Z1231 Encounter for screening mammogram for malignant neoplasm of breast: Secondary | ICD-10-CM

## 2019-11-30 ENCOUNTER — Other Ambulatory Visit: Payer: Self-pay

## 2019-11-30 ENCOUNTER — Ambulatory Visit
Admission: RE | Admit: 2019-11-30 | Discharge: 2019-11-30 | Disposition: A | Discharge status: Home / Self Care | Payer: Self-pay | Source: Ambulatory Visit | Attending: Internal Medicine | Admitting: Internal Medicine

## 2019-11-30 DIAGNOSIS — Z1231 Encounter for screening mammogram for malignant neoplasm of breast: Secondary | ICD-10-CM

## 2019-12-05 ENCOUNTER — Other Ambulatory Visit: Payer: Self-pay | Admitting: Internal Medicine

## 2019-12-05 DIAGNOSIS — R928 Other abnormal and inconclusive findings on diagnostic imaging of breast: Secondary | ICD-10-CM

## 2019-12-17 ENCOUNTER — Other Ambulatory Visit: Payer: BC Managed Care – PPO

## 2019-12-17 ENCOUNTER — Ambulatory Visit
Admission: RE | Admit: 2019-12-17 | Discharge: 2019-12-17 | Disposition: A | Discharge status: Home / Self Care | Payer: BC Managed Care – PPO | Source: Ambulatory Visit | Attending: Internal Medicine | Admitting: Internal Medicine

## 2019-12-17 ENCOUNTER — Other Ambulatory Visit: Payer: Self-pay

## 2019-12-17 DIAGNOSIS — R928 Other abnormal and inconclusive findings on diagnostic imaging of breast: Secondary | ICD-10-CM

## 2020-11-26 ENCOUNTER — Other Ambulatory Visit: Payer: Self-pay | Admitting: Internal Medicine

## 2020-11-26 DIAGNOSIS — Z1231 Encounter for screening mammogram for malignant neoplasm of breast: Secondary | ICD-10-CM

## 2020-12-24 ENCOUNTER — Ambulatory Visit
Admission: RE | Admit: 2020-12-24 | Discharge: 2020-12-24 | Disposition: A | Discharge status: Home / Self Care | Payer: BC Managed Care – PPO | Source: Ambulatory Visit | Attending: Internal Medicine | Admitting: Internal Medicine

## 2020-12-24 DIAGNOSIS — Z1231 Encounter for screening mammogram for malignant neoplasm of breast: Secondary | ICD-10-CM

## 2021-11-19 ENCOUNTER — Other Ambulatory Visit: Payer: Self-pay | Admitting: Internal Medicine

## 2021-11-19 DIAGNOSIS — Z1231 Encounter for screening mammogram for malignant neoplasm of breast: Secondary | ICD-10-CM

## 2021-12-28 ENCOUNTER — Ambulatory Visit
Admission: RE | Admit: 2021-12-28 | Discharge: 2021-12-28 | Disposition: A | Discharge status: Home / Self Care | Payer: BC Managed Care – PPO | Source: Ambulatory Visit | Attending: Internal Medicine | Admitting: Internal Medicine

## 2021-12-28 DIAGNOSIS — Z1231 Encounter for screening mammogram for malignant neoplasm of breast: Secondary | ICD-10-CM

## 2021-12-30 ENCOUNTER — Other Ambulatory Visit: Payer: Self-pay | Admitting: Internal Medicine

## 2021-12-30 DIAGNOSIS — R928 Other abnormal and inconclusive findings on diagnostic imaging of breast: Secondary | ICD-10-CM

## 2022-01-04 ENCOUNTER — Ambulatory Visit
Admission: RE | Admit: 2022-01-04 | Discharge: 2022-01-04 | Disposition: A | Discharge status: Home / Self Care | Payer: BC Managed Care – PPO | Source: Ambulatory Visit | Attending: Internal Medicine | Admitting: Internal Medicine

## 2022-01-04 ENCOUNTER — Other Ambulatory Visit: Payer: Self-pay | Admitting: Internal Medicine

## 2022-01-04 DIAGNOSIS — R928 Other abnormal and inconclusive findings on diagnostic imaging of breast: Secondary | ICD-10-CM

## 2022-01-18 ENCOUNTER — Ambulatory Visit (INDEPENDENT_AMBULATORY_CARE_PROVIDER_SITE_OTHER): Payer: BC Managed Care – PPO

## 2022-01-18 ENCOUNTER — Ambulatory Visit: Payer: BC Managed Care – PPO | Admitting: Podiatry

## 2022-01-18 DIAGNOSIS — M21612 Bunion of left foot: Secondary | ICD-10-CM

## 2022-01-18 DIAGNOSIS — M21611 Bunion of right foot: Secondary | ICD-10-CM | POA: Diagnosis not present

## 2022-01-18 NOTE — Progress Notes (Signed)
Chief Complaint  Patient presents with   Bunions    Bilateral bunions , patient states she has always has issues with her bunions but her PCP told her if they are not bad she doesn't need to worry about them. Patient states pain various depending on shoe wear.  And when her bunions are painful it is like a throbbing pain. Patient states she is a diabetic.     Subjective: 41 y.o. female presents today as a new patient for evaluation of symptomatic bunions to the bilateral feet that are progressively gotten worse over the last few years.  Patient states that she has always had bunions but specifically over the last few years they have become increasingly painful and bothersome.  She has tried wearing different shoes without any relief of her symptoms.  Patient presents for further treatment and evaluation  Past Medical History:  Diagnosis Date   Anemia    Diabetes mellitus    gestational   GERD (gastroesophageal reflux disease)    with pregnancy   Gestational diabetes    Glucose intolerance (pre-diabetes)     Past Surgical History:  Procedure Laterality Date   CESAREAN SECTION  04/26/1998   CESAREAN SECTION     CESAREAN SECTION N/A 08/31/2013   Procedure: CESAREAN SECTION;  Surgeon: Levi Aland, MD;  Location: [facility];  Service: Obstetrics;  Laterality: N/A;   CHOLECYSTECTOMY  06/26/1998   CHOLECYSTECTOMY     NASAL POLYP EXCISION  08/13/1998    No Known Allergies   Objective: Physical Exam General: The patient is alert and oriented x3 in no acute distress.  Dermatology: Skin is cool, dry and supple bilateral lower extremities. Negative for open lesions or macerations.  Vascular: Palpable pedal pulses bilaterally. No edema or erythema noted. Capillary refill within normal limits.  Neurological: Epicritic and protective threshold grossly intact bilaterally.   Musculoskeletal Exam: Clinical evidence of bunion deformity noted to the respective foot. There is moderate pain on  palpation range of motion of the first MPJ. Lateral deviation of the hallux noted consistent with hallux abductovalgus.  Radiographic Exam: Normal osseous mineralization.  No acute fractures identified.  Increased intermetatarsal angle greater than 15 with a hallux abductus angle greater than 30 noted on AP view.   Assessment: 1.  Hallux valgus bilateral   Plan of Care:  1. Patient was evaluated. X-Rays reviewed. 2.  Today we discussed the pathology and etiology of bunion deformity.  Also discussed both conservative and surgical management.  Unfortunately patient has tried multiple conservative modalities including shoe gear modifications with no improvement. 3.  Surgery was discussed in detail.  Risk benefits advantages and disadvantages were all explained.  No guarantees were expressed or implied.  I do believe it is appropriate at this time to proceed with surgical correction of the bunion deformity.  Discussed unilateral versus bilateral surgeries.  Patient states that she would like to go home and think about the surgery and plan for it.  She is currently very busy with her work schedule and she will need to take approximately 4 to 6 weeks off of work.  Patient understands. 4.  If the patient would like to pursue surgery over the next few months she can come in with our surgery scheduler to sign the surgical consent forms.  Surgery will consist of bunionectomy with osteotomy. 5.  If the patient would like to pursue bunion surgery later recommend that she comes back in for another surgical consultation and evaluation to discuss  Felecia Shelling, DPM [facility]  Dr. Felecia Shelling, DPM    [address] [place], [place] [address]                Office [phone]  Fax [phone]

## 2022-06-30 ENCOUNTER — Ambulatory Visit
Admission: RE | Admit: 2022-06-30 | Discharge: 2022-06-30 | Disposition: A | Discharge status: Home / Self Care | Payer: BC Managed Care – PPO | Source: Ambulatory Visit | Attending: Internal Medicine | Admitting: Internal Medicine

## 2022-06-30 DIAGNOSIS — R928 Other abnormal and inconclusive findings on diagnostic imaging of breast: Secondary | ICD-10-CM

## 2022-07-14 ENCOUNTER — Other Ambulatory Visit: Payer: Self-pay | Admitting: Internal Medicine

## 2022-07-14 DIAGNOSIS — N6310 Unspecified lump in the right breast, unspecified quadrant: Secondary | ICD-10-CM

## 2023-01-10 ENCOUNTER — Ambulatory Visit
Admission: RE | Admit: 2023-01-10 | Discharge: 2023-01-10 | Disposition: A | Discharge status: Home / Self Care | Payer: BC Managed Care – PPO | Source: Ambulatory Visit | Attending: Internal Medicine | Admitting: Internal Medicine

## 2023-01-10 DIAGNOSIS — N6310 Unspecified lump in the right breast, unspecified quadrant: Secondary | ICD-10-CM

## 2024-01-09 ENCOUNTER — Other Ambulatory Visit: Payer: Self-pay | Admitting: Internal Medicine

## 2024-01-09 DIAGNOSIS — Z1231 Encounter for screening mammogram for malignant neoplasm of breast: Secondary | ICD-10-CM

## 2024-02-13 ENCOUNTER — Ambulatory Visit
Admission: RE | Admit: 2024-02-13 | Discharge: 2024-02-13 | Disposition: A | Discharge status: Home / Self Care | Source: Ambulatory Visit | Attending: Internal Medicine | Admitting: Internal Medicine

## 2024-02-13 DIAGNOSIS — Z1231 Encounter for screening mammogram for malignant neoplasm of breast: Secondary | ICD-10-CM
